# Patient Record
Sex: Female | Born: 1970
Health system: Southern US, Community
[De-identification: ages and names within clinical notes are randomized; demographics above are authoritative.]

## PROBLEM LIST (undated history)

## (undated) DIAGNOSIS — Z9889 Other specified postprocedural states: Secondary | ICD-10-CM

## (undated) DIAGNOSIS — S82841A Displaced bimalleolar fracture of right lower leg, initial encounter for closed fracture: Secondary | ICD-10-CM

## (undated) DIAGNOSIS — D649 Anemia, unspecified: Secondary | ICD-10-CM

## (undated) DIAGNOSIS — E785 Hyperlipidemia, unspecified: Secondary | ICD-10-CM

## (undated) DIAGNOSIS — D573 Sickle-cell trait: Secondary | ICD-10-CM

## (undated) HISTORY — PX: OOPHORECTOMY: SHX86

## (undated) HISTORY — DX: Sickle-cell trait: D57.3

## (undated) HISTORY — DX: Hyperlipidemia, unspecified: E78.5

## (undated) HISTORY — PX: WISDOM TOOTH EXTRACTION: SHX21

## (undated) HISTORY — PX: OVARIAN CYST SURGERY: SHX726

---

## 1999-08-12 ENCOUNTER — Encounter (INDEPENDENT_AMBULATORY_CARE_PROVIDER_SITE_OTHER): Payer: Self-pay

## 1999-08-12 ENCOUNTER — Inpatient Hospital Stay (HOSPITAL_COMMUNITY): Admission: AD | Admit: 1999-08-12 | Discharge: 1999-08-16 | Payer: Self-pay | Admitting: Obstetrics and Gynecology

## 1999-08-16 ENCOUNTER — Inpatient Hospital Stay (HOSPITAL_COMMUNITY): Admission: AD | Admit: 1999-08-16 | Discharge: 1999-08-16 | Payer: Self-pay | Admitting: Obstetrics & Gynecology

## 2000-08-17 ENCOUNTER — Encounter: Admission: RE | Admit: 2000-08-17 | Discharge: 2000-08-17 | Payer: Self-pay | Admitting: Obstetrics and Gynecology

## 2000-08-17 ENCOUNTER — Encounter: Payer: Self-pay | Admitting: Obstetrics and Gynecology

## 2001-03-14 ENCOUNTER — Encounter: Admission: RE | Admit: 2001-03-14 | Discharge: 2001-03-14 | Payer: Self-pay | Admitting: Cardiology

## 2001-03-14 ENCOUNTER — Encounter: Payer: Self-pay | Admitting: Cardiology

## 2001-11-15 ENCOUNTER — Other Ambulatory Visit: Admission: RE | Admit: 2001-11-15 | Discharge: 2001-11-15 | Payer: Self-pay | Admitting: Obstetrics and Gynecology

## 2002-12-03 ENCOUNTER — Other Ambulatory Visit: Admission: RE | Admit: 2002-12-03 | Discharge: 2002-12-03 | Payer: Self-pay | Admitting: Obstetrics and Gynecology

## 2004-01-15 ENCOUNTER — Other Ambulatory Visit: Admission: RE | Admit: 2004-01-15 | Discharge: 2004-01-15 | Payer: Self-pay | Admitting: Obstetrics and Gynecology

## 2004-02-02 ENCOUNTER — Emergency Department (HOSPITAL_COMMUNITY): Admission: EM | Admit: 2004-02-02 | Discharge: 2004-02-02 | Payer: Self-pay | Admitting: Emergency Medicine

## 2005-04-15 ENCOUNTER — Other Ambulatory Visit: Admission: RE | Admit: 2005-04-15 | Discharge: 2005-04-15 | Payer: Self-pay | Admitting: Obstetrics and Gynecology

## 2007-07-13 ENCOUNTER — Inpatient Hospital Stay (HOSPITAL_COMMUNITY): Admission: RE | Admit: 2007-07-13 | Discharge: 2007-07-16 | Payer: Self-pay | Admitting: Obstetrics and Gynecology

## 2011-02-09 NOTE — H&P (Signed)
NAMEARETHA, Haley Pitts               ACCOUNT NO.:  [id number]   MEDICAL RECORD NO.:  [id number]          PATIENT TYPE:  INP   LOCATION:  NA                            [facility]  [facility]   PHYSICIAN:  Guy Sandifer. Henderson Cloud, M.D. DATE OF BIRTH:  1971-06-12   DATE OF ADMISSION:  07/13/2007  DATE OF DISCHARGE:                              HISTORY & PHYSICAL   CHIEF COMPLAINT:  Desires repeat cesarean section.   HISTORY OF PRESENT ILLNESS:  This patient is a 40 year old black female,  G4, P1 with an EDC of July 22, 2007, confirmed by first trimester  ultrasound, who has a history of cesarean section. After discussion of  the options she desires repeat. Potential risks and complications have  been discussed preoperatively.   PAST MEDICAL HISTORY/PAST SURGICAL HISTORY/FAMILY HISTORY/OBSTETRIC  HISTORY:  See prenatal history and physical.   SOCIAL HISTORY:  The patient denies tobacco, alcohol, or drug abuse.   MEDICATIONS:  Prenatal vitamins.   ALLERGIES:  NO KNOWN DRUG ALLERGIES.   REVIEW OF SYSTEMS:  NEUROLOGIC:  Denies headache. CARDIOVASCULAR:  Denies chest pain. PULMONARY:  Denies shortness of breath.  GASTROINTESTINAL:  Denies recent changes in bowel habits.   PHYSICAL EXAMINATION:  VITAL SIGNS:  Height 5 feet 7 inches. Weight 217  pounds. Blood pressure 96/60.  HEENT:  Without thyromegaly.  LUNGS:  Clear to auscultation.  HEART:  Regular rate and rhythm.  BACK: Without CVA tenderness.  BREAST:  Not examined.  ABDOMEN:  Gravid. No epigastric tenderness.  PELVIC:  Cervix closed, thick, and high.  EXTREMITIES:  DTR's 2+.   ASSESSMENT:  Desires repeat cesarean section.   PLAN:  Repeat cesarean section.      Guy Sandifer Henderson Cloud, M.D.  Electronically Signed    JET/MEDQ  D:  07/06/2007  T:  07/06/2007  Job:  [id number]

## 2011-02-09 NOTE — H&P (Signed)
NAMENEELEY, SEDIVY               ACCOUNT NO.:  [id number]   MEDICAL RECORD NO.:  [id number]          PATIENT TYPE:  INP   LOCATION:  9132                          [facility]   PHYSICIAN:  Guy Sandifer. Henderson Cloud, M.D. DATE OF BIRTH:  04-26-1971   DATE OF ADMISSION:  07/13/2007  DATE OF DISCHARGE:                              HISTORY & PHYSICAL   .   CHIEF COMPLAINT:  Desires repeat cesarean section.   HISTORY OF PRESENT ILLNESS:  The patient is a 40 year old African-  American female, G4, P1 with a history of previous cesarean section.  Her EDC is July 22, 2007 confirmed by first trimester ultrasound.  After discussion of options, she prefers repeat cesarean section.  Potential risks and complications have been reviewed preoperatively.   Past medical history, past surgical history, family history, obstetric  history, see prenatal history and physical.   MEDICATIONS:  Prenatal vitamins.   ALLERGIES:  NO KNOWN DRUG ALLERGIES.   REVIEW OF SYSTEMS:  NEURO:  Denies headache.  CARDIAC:  Denies chest  pain.  PULMONARY:  Denies  shortness of breath.  GI:  Denies recent  change in bowel habits.   PHYSICAL EXAMINATION:  VITAL SIGNS:  Height 5 fee5 7 inches, weight 217  pounds, blood pressure 96/60.  LUNGS:  Clear to auscultation.  HEART:  Regular rate and rhythm.  BACK:  Without CVA tenderness.  BREASTS:  Not examined.  ABDOMEN:  Gravid.  Cervix closed, thick and high.  EXTREMITIES:  Grossly within normal limits.  NEUROLOGIC:  Grossly within normal limits.   ASSESSMENT:  Previous cesarean section and desires repeat.   PLAN:  Repeat cesarean section.      Guy Sandifer Henderson Cloud, M.D.  Electronically Signed     JET/MEDQ  D:  07/12/2007  T:  07/13/2007  Job:  [id number]

## 2011-02-09 NOTE — Op Note (Signed)
NAMECALYN, Haley Pitts               ACCOUNT NO.:  [id number]   MEDICAL RECORD NO.:  [id number]          PATIENT TYPE:  INP   LOCATION:  NA                            [facility]   PHYSICIAN:  Guy Sandifer. Henderson Cloud, M.D. DATE OF BIRTH:  Oct 17, 1970   DATE OF PROCEDURE:  07/13/2007  DATE OF DISCHARGE:                               OPERATIVE REPORT   PREOPERATIVE DIAGNOSES:  1. Anterior pregnancy at term.  2. Previous cesarean section, desires repeat.   POSTOPERATIVE DIAGNOSES:  1. Anterior pregnancy at term.  2. Previous cesarean section, desires repeat.   PROCEDURE:  Repeat low transverse cesarean section.   SURGEON:  Guy Sandifer. Henderson Cloud, M.D.   ANESTHESIA:  Spinal, Joanette Gula M.D.   ESTIMATED BLOOD LOSS:  800 mL.   FINDINGS:  Viable female infant, Apgars of 9 and 9 at one and five  minutes, respectively.  Birth weight and arterial cord pH pending.   INDICATIONS AND CONSENT:  This patient is a 40 year old black female G4,  P1, with an EDC of July 22, 2007, confirmed by first trimester  ultrasound.  Details are dictated in the history and physical.  She has  a history of a cesarean section and, after discussion of options,  desires repeat.  Potential risks and complications have been discussed  preoperatively, including but not limited to infection, organ damage,  bleeding requiring transfusion with HIV and hepatitis risk, DVT, PE, and  pneumonia.  All questions have been answered, and consent is signed in  the chart.   PROCEDURE:  The patient is taken to the operating room, where she is  identified, spinal anesthetic is placed, and she is placed in the dorsal  supine position with a 15-degree left lateral wedge.  She is then  prepped, Foley catheter is placed, and the bladder is drained, and she  is draped in a sterile fashion.  After testing for adequate spinal  anesthesia, skin is entered through a Pfannenstiel incision.  Old scar  is taken down out on the way in.   Dissection is carried out in layers to  the peritoneum.  Peritoneum is incised and extended superiorly and  inferiorly.  Vesicouterine peritoneum is taken down cephalolaterally.  The bladder flap is developed, and the bladder blade is placed.  Uterus  is incised in a low transverse manner, and the uterine cavity is entered  bluntly with a hemostat.  The uterine incision is extended  cephalolaterally with the fingers.  Artificial rupture of membranes for  clear fluid is carried out.  The vertex is delivered without difficulty.  Oro- and nasopharynx were suctioned.  A nuchal cord x1 is reduced.  Baby  is then delivered without difficulty.  Good cry and tone is noted.  Cord  is clamped and cut.  The baby is handed to the awaiting pediatrics team.  Placenta is manually delivered.  Uterine cavity is clean.  Uterus is  closed in two running-locking imbricating layers of 0 Monocryl suture.  Figure-of-eight 0 Monocryl sutures are applied to the incision to obtain  complete hemostasis.  Tubes and ovaries are normal  bilaterally.  Anterior peritoneum is closed in a running  fashion with 0 Monocryl suture.  Rectus fascia is closed in a running  fashion with 0 PDS suture, and the skin is closed with clips.  All  sponge, instrument, and needle counts are correct, and the patient is  transferred to the recovery room in stable condition.      Guy Sandifer Henderson Cloud, M.D.  Electronically Signed     JET/MEDQ  D:  07/13/2007  T:  07/14/2007  Job:  [id number]

## 2011-02-12 NOTE — Discharge Summary (Signed)
[name], Haley Pitts               ACCOUNT NO.:  [id number]   MEDICAL RECORD NO.:  [id number]          PATIENT TYPE:  INP   LOCATION:  9132                          [facility]   PHYSICIAN:  Guy Sandifer. Henderson Cloud, M.D. DATE OF BIRTH:  Jun 03, 1971   DATE OF ADMISSION:  07/13/2007  DATE OF DISCHARGE:  07/16/2007                               DISCHARGE SUMMARY   ADMITTING DIAGNOSES:  1. Intrauterine pregnancy at term.  2. Previous cesarean section, desires repeat.   DISCHARGE DIAGNOSES:  1. Status post low transverse cesarean section.  2. Viable female infant.   PROCEDURE:  Repeat low transverse cesarean section.   REASON FOR ADMISSION:  Please see dictated H&P.   HOSPITAL COURSE:  The patient is 40 year old black female gravida 4,  para 1 that was admitted to [facility] for scheduled  cesarean section.  The patient had had a previous cesarean delivery and  desired repeat.  On the morning of admission, the patient was taken to  the operating room where spinal anesthesia was administered without  difficulty.  A low transverse incision was made with delivery of a  viable female infant weighing 7 pounds 13 ounces, Apgars of 9 at one  minute and 9 at 5 minutes.  The patient tolerated the procedure well and  was taken to the recovery room in stable condition.  On postoperative  day #1, the patient was without complaint.  Vital signs remained stable.  She was afebrile.  Fundus firm and nontender.  Abdominal dressing was  noted be clean, dry and intact.  Laboratory findings revealed hemoglobin  of 9.1.  Postoperative day #2 the patient was without complaint.  Vital  signs were stable.  Abdomen soft.  Fundus firm and nontender.  Abdominal  dressing had been removed revealing an incision that was clean, dry and  intact.  She was tolerating a regular diet by complaints of nausea,  vomiting, ambulating without difficulty.  On postoperative day #3, the  patient was without complaint.   Vital signs remained stable.  She was  afebrile.  Fundus firm and nontender.  Incision was clean, dry and  intact.  Staples removed and the patient was later discharged home.   CONDITION ON DISCHARGE:  Stable.   DISCHARGE INSTRUCTIONS:  1. Diet:  Regular as tolerated.  2. Activity:  No heavy lifting, no driving x2 weeks, no vaginal entry.   FOLLOW UP:  Patient to follow up in the office in 1-2 weeks for an  incision check.  She is to call for temperature greater than 100  degrees, persistent nausea, vomiting, heavy vaginal bleeding and/or  redness or drainage from the incisional site.   DISCHARGE MEDICATIONS:  1. Percocet 5/325 #30 one p.o. q.4-6 h p.r.n.  2. Motrin 600 mg q.6 h.  3. Prenatal vitamins one p.o. daily.  4. Colace one p.o. daily.      Julio Sicks, N.P.      Guy Sandifer. Henderson Cloud, M.D.  Electronically Signed    CC/MEDQ  D:  08/07/2007  T:  08/07/2007  Job:  [id number]

## 2011-02-12 NOTE — Op Note (Signed)
[facility]  Patient:    Haley Pitts                       MRN: [id number] Proc. Date: 08/13/99 Adm. Date:  [id number] Attending:  Donne Hazel                           Operative Report  PREOPERATIVE DIAGNOSIS:       Arrest of cervical dilation.  Nonreassuring fetal  heart rate tracing.  POSTOPERATIVE DIAGNOSIS:      Arrest of cervical dilation.  Nonreassuring fetal  heart rate tracing.  OPERATION:                    Primary low transverse cesarean section.  SURGEON:                      Guy Sandifer. Arleta Creek, M.D.  ASSISTANT:  ANESTHESIA:                   Epidural.  ESTIMATED BLOOD LOSS:         800 cc.  FINDINGS:                     A viable female infant with Apgars of 8 and 9 at ne and five minutes, respectively.  Birth weight pending.  Arterial cord pH 7.29.   INDICATIONS:                  This patient is a 40 year old married black female, gravida 2, para 0, abortus 1, at 40-4/7 weeks who was admitted yesterday after  spontaneous decelleration was noted on the nonstress test in the office.  The patient was admitted for induction of labor.  She was making steady progress. he infant would intermittently display decellerations which would then recover. Thick meconium was noted and IUPC was placed with amnioinfusion during the night. The patient progressed to 6 to 7 cm and -2 station with no progress over two hours ith adequate labor.  Cesarean section was recommended.  The procedure and possible risks was discussed with the patient and her husband.  All questions were answered.  DESCRIPTION OF PROCEDURE:     The patient is taken to the operating room where epidural anesthetic is augmented to a surgical level.  Foley catheter is then placed.  She is prepped and draped in the usual sterile fashion.  After testing for adequate epidural anesthesia, the skin is entered through a Pfannenstiel incision and dissection is  carried out in layers to the peritoneum.  The peritoneum is incised and extended superiorly and inferiorly.  The vesicouterine peritoneum is taken down cephalolaterally, the bladder flap is developed, and the bladder blade is placed.  The uterus is incised in the low transverse manner and the cavity is entered bluntly.  The uterine incision is extended cephalolaterally with the fingers.  The vertex is delivered.  Oral and nasopharynx are thoroughly suctioned with the DeLee.  The infant cries upon delivery of the head.  The remainder of he infant is then delivered, cord is clamped and cut, and the infant is handed to he awaiting pediatrics team.  The placenta is manually delivered and sent to pathology.  Internal uterine contour is normal.  The uterus is closed in a running locking layer of 0 Monocryl suture.  Good hemostasis is noted.  Irrigation is carried out.  Anterior peritoneum is closed in a running fashion with 0 Monocryl suture which is also used to reapproximate the pyramidalis muscle in the midline. The anterior rectus fascia is closed in a running fashion with 0 PDS suture. The skin is closed with clips.  All sponge, needle, and instrument counts were correct. The patient is transferred to the recovery room in stable condition. DD:  08/13/99 TD:  08/13/99 Job: [id number]

## 2011-07-07 LAB — CBC
HCT: 27.3 — ABNORMAL LOW
HCT: 33.8 — ABNORMAL LOW
Hemoglobin: 11.5 — ABNORMAL LOW
Hemoglobin: 9.1 — ABNORMAL LOW
MCHC: 33.5
MCHC: 34.1
MCV: 85.8
MCV: 86.8
Platelets: 188
Platelets: 218
RBC: 3.15 — ABNORMAL LOW
RBC: 3.94
RDW: 14.4 — ABNORMAL HIGH
RDW: 14.6 — ABNORMAL HIGH
WBC: 14.2 — ABNORMAL HIGH
WBC: 6.2

## 2011-07-07 LAB — RPR: RPR Ser Ql: NONREACTIVE

## 2018-08-16 DIAGNOSIS — Z0000 Encounter for general adult medical examination without abnormal findings: Secondary | ICD-10-CM | POA: Diagnosis not present

## 2018-08-16 DIAGNOSIS — Z23 Encounter for immunization: Secondary | ICD-10-CM | POA: Diagnosis not present

## 2018-08-16 DIAGNOSIS — Z0001 Encounter for general adult medical examination with abnormal findings: Secondary | ICD-10-CM | POA: Diagnosis not present

## 2018-08-16 DIAGNOSIS — R102 Pelvic and perineal pain: Secondary | ICD-10-CM | POA: Diagnosis not present

## 2018-09-28 DIAGNOSIS — R1032 Left lower quadrant pain: Secondary | ICD-10-CM | POA: Diagnosis not present

## 2018-10-05 DIAGNOSIS — R1032 Left lower quadrant pain: Secondary | ICD-10-CM | POA: Diagnosis not present

## 2018-10-05 DIAGNOSIS — N951 Menopausal and female climacteric states: Secondary | ICD-10-CM | POA: Diagnosis not present

## 2018-11-15 DIAGNOSIS — N858 Other specified noninflammatory disorders of uterus: Secondary | ICD-10-CM | POA: Diagnosis not present

## 2018-11-15 DIAGNOSIS — N950 Postmenopausal bleeding: Secondary | ICD-10-CM | POA: Diagnosis not present

## 2018-11-15 DIAGNOSIS — N83202 Unspecified ovarian cyst, left side: Secondary | ICD-10-CM | POA: Diagnosis not present

## 2018-12-04 DIAGNOSIS — Z6830 Body mass index (BMI) 30.0-30.9, adult: Secondary | ICD-10-CM | POA: Diagnosis not present

## 2018-12-04 DIAGNOSIS — Z01419 Encounter for gynecological examination (general) (routine) without abnormal findings: Secondary | ICD-10-CM | POA: Diagnosis not present

## 2018-12-04 DIAGNOSIS — Z1231 Encounter for screening mammogram for malignant neoplasm of breast: Secondary | ICD-10-CM | POA: Diagnosis not present

## 2018-12-06 ENCOUNTER — Other Ambulatory Visit: Payer: Self-pay | Admitting: Obstetrics and Gynecology

## 2018-12-06 DIAGNOSIS — R928 Other abnormal and inconclusive findings on diagnostic imaging of breast: Secondary | ICD-10-CM

## 2018-12-11 ENCOUNTER — Other Ambulatory Visit: Payer: Self-pay | Admitting: Obstetrics and Gynecology

## 2018-12-11 ENCOUNTER — Other Ambulatory Visit: Payer: Self-pay

## 2018-12-11 ENCOUNTER — Ambulatory Visit
Admission: RE | Admit: 2018-12-11 | Discharge: 2018-12-11 | Disposition: A | Discharge status: Home / Self Care | Payer: BLUE CROSS/BLUE SHIELD | Source: Ambulatory Visit | Attending: Obstetrics and Gynecology | Admitting: Obstetrics and Gynecology

## 2018-12-11 DIAGNOSIS — N63 Unspecified lump in unspecified breast: Secondary | ICD-10-CM

## 2018-12-11 DIAGNOSIS — R928 Other abnormal and inconclusive findings on diagnostic imaging of breast: Secondary | ICD-10-CM

## 2018-12-11 DIAGNOSIS — N6324 Unspecified lump in the left breast, lower inner quadrant: Secondary | ICD-10-CM | POA: Diagnosis not present

## 2018-12-11 DIAGNOSIS — R922 Inconclusive mammogram: Secondary | ICD-10-CM | POA: Diagnosis not present

## 2018-12-11 DIAGNOSIS — N6322 Unspecified lump in the left breast, upper inner quadrant: Secondary | ICD-10-CM | POA: Diagnosis not present

## 2019-01-18 DIAGNOSIS — N83202 Unspecified ovarian cyst, left side: Secondary | ICD-10-CM | POA: Diagnosis not present

## 2019-03-06 DIAGNOSIS — N83202 Unspecified ovarian cyst, left side: Secondary | ICD-10-CM | POA: Diagnosis not present

## 2019-03-12 ENCOUNTER — Other Ambulatory Visit: Payer: Self-pay | Admitting: Obstetrics and Gynecology

## 2019-03-12 ENCOUNTER — Other Ambulatory Visit (HOSPITAL_COMMUNITY)
Admission: RE | Admit: 2019-03-12 | Discharge: 2019-03-12 | Disposition: A | Discharge status: Home / Self Care | Payer: BC Managed Care – PPO | Source: Ambulatory Visit | Attending: Obstetrics and Gynecology | Admitting: Obstetrics and Gynecology

## 2019-03-12 DIAGNOSIS — N8302 Follicular cyst of left ovary: Secondary | ICD-10-CM | POA: Diagnosis not present

## 2019-03-12 DIAGNOSIS — N83202 Unspecified ovarian cyst, left side: Secondary | ICD-10-CM | POA: Insufficient documentation

## 2019-06-13 ENCOUNTER — Ambulatory Visit
Admission: RE | Admit: 2019-06-13 | Discharge: 2019-06-13 | Disposition: A | Discharge status: Home / Self Care | Payer: BC Managed Care – PPO | Source: Ambulatory Visit | Attending: Obstetrics and Gynecology | Admitting: Obstetrics and Gynecology

## 2019-06-13 ENCOUNTER — Other Ambulatory Visit: Payer: Self-pay

## 2019-06-13 ENCOUNTER — Other Ambulatory Visit: Payer: Self-pay | Admitting: Obstetrics and Gynecology

## 2019-06-13 ENCOUNTER — Ambulatory Visit
Admission: RE | Admit: 2019-06-13 | Discharge: 2019-06-13 | Disposition: A | Discharge status: Home / Self Care | Payer: BLUE CROSS/BLUE SHIELD | Source: Ambulatory Visit | Attending: Obstetrics and Gynecology | Admitting: Obstetrics and Gynecology

## 2019-06-13 DIAGNOSIS — N63 Unspecified lump in unspecified breast: Secondary | ICD-10-CM

## 2019-06-13 DIAGNOSIS — R928 Other abnormal and inconclusive findings on diagnostic imaging of breast: Secondary | ICD-10-CM | POA: Diagnosis not present

## 2019-06-13 DIAGNOSIS — N6489 Other specified disorders of breast: Secondary | ICD-10-CM | POA: Diagnosis not present

## 2019-06-26 ENCOUNTER — Other Ambulatory Visit: Payer: Self-pay | Admitting: Obstetrics and Gynecology

## 2019-08-13 DIAGNOSIS — E7800 Pure hypercholesterolemia, unspecified: Secondary | ICD-10-CM | POA: Diagnosis not present

## 2019-08-13 DIAGNOSIS — Z0000 Encounter for general adult medical examination without abnormal findings: Secondary | ICD-10-CM | POA: Diagnosis not present

## 2019-08-20 DIAGNOSIS — Z7189 Other specified counseling: Secondary | ICD-10-CM | POA: Diagnosis not present

## 2019-08-20 DIAGNOSIS — Z0000 Encounter for general adult medical examination without abnormal findings: Secondary | ICD-10-CM | POA: Diagnosis not present

## 2019-12-11 DIAGNOSIS — N952 Postmenopausal atrophic vaginitis: Secondary | ICD-10-CM | POA: Diagnosis not present

## 2019-12-11 DIAGNOSIS — Z6830 Body mass index (BMI) 30.0-30.9, adult: Secondary | ICD-10-CM | POA: Diagnosis not present

## 2019-12-11 DIAGNOSIS — Z01419 Encounter for gynecological examination (general) (routine) without abnormal findings: Secondary | ICD-10-CM | POA: Diagnosis not present

## 2019-12-12 ENCOUNTER — Ambulatory Visit
Admission: RE | Admit: 2019-12-12 | Discharge: 2019-12-12 | Disposition: A | Discharge status: Home / Self Care | Payer: BC Managed Care – PPO | Source: Ambulatory Visit | Attending: Obstetrics and Gynecology | Admitting: Obstetrics and Gynecology

## 2019-12-12 ENCOUNTER — Other Ambulatory Visit: Payer: Self-pay

## 2019-12-12 ENCOUNTER — Other Ambulatory Visit: Payer: BC Managed Care – PPO

## 2019-12-12 DIAGNOSIS — N63 Unspecified lump in unspecified breast: Secondary | ICD-10-CM

## 2019-12-12 DIAGNOSIS — N6325 Unspecified lump in the left breast, overlapping quadrants: Secondary | ICD-10-CM | POA: Diagnosis not present

## 2020-02-04 ENCOUNTER — Ambulatory Visit: Payer: BC Managed Care – PPO | Attending: Internal Medicine

## 2020-02-04 DIAGNOSIS — Z23 Encounter for immunization: Secondary | ICD-10-CM

## 2020-02-04 NOTE — Progress Notes (Signed)
   Covid-19 Vaccination Clinic  Name:  Haley Pitts    MRN: [id number] DOB: 01/31/1971  02/04/2020  [name] was observed post Covid-19 immunization for 15 minutes without incident. She was provided with Vaccine Information Sheet and instruction to access the V-Safe system.   [name] was instructed to call 911 with any severe reactions post vaccine: Marland Kitchen Difficulty breathing  . Swelling of face and throat  . A fast heartbeat  . A bad rash all over body  . Dizziness and weakness   Immunizations Administered    Name Date Dose VIS Date Route   Pfizer COVID-19 Vaccine 02/04/2020  2:36 PM 0.3 mL 11/21/2018 Intramuscular   Manufacturer: [employer], [employer]   Lot: [id number]   NDC: [address]-0

## 2020-02-26 ENCOUNTER — Ambulatory Visit: Payer: BC Managed Care – PPO | Attending: Internal Medicine

## 2020-02-26 DIAGNOSIS — Z23 Encounter for immunization: Secondary | ICD-10-CM

## 2020-02-26 NOTE — Progress Notes (Signed)
   Covid-19 Vaccination Clinic  Name:  Haley Pitts    MRN: [id number] DOB: 04/10/1971  02/26/2020  [name] was observed post Covid-19 immunization for 15 minutes without incident. She was provided with Vaccine Information Sheet and instruction to access the V-Safe system.   [name] was instructed to call 911 with any severe reactions post vaccine: Marland Kitchen Difficulty breathing  . Swelling of face and throat  . A fast heartbeat  . A bad rash all over body  . Dizziness and weakness   Immunizations Administered    Name Date Dose VIS Date Route   Pfizer COVID-19 Vaccine 02/26/2020  1:04 PM 0.3 mL 11/21/2018 Intramuscular   Manufacturer: [employer], [employer]   Lot: [id number]   NDC: [address]-3

## 2020-09-23 ENCOUNTER — Other Ambulatory Visit: Payer: Self-pay | Admitting: Obstetrics and Gynecology

## 2020-09-23 DIAGNOSIS — N6320 Unspecified lump in the left breast, unspecified quadrant: Secondary | ICD-10-CM

## 2020-09-24 DIAGNOSIS — Z1159 Encounter for screening for other viral diseases: Secondary | ICD-10-CM | POA: Diagnosis not present

## 2020-10-14 ENCOUNTER — Ambulatory Visit: Payer: Self-pay | Admitting: Cardiovascular Disease

## 2020-10-16 NOTE — Progress Notes (Signed)
Cardiology Office Note:   Date:  10/17/2020  NAME:  Haley Pitts    MRN: [id number] DOB:  July 07, 1971   PCP:  Aliene Beams, MD  Cardiologist:  No primary care provider on file.  Electrophysiologist:  None   Referring MD: Aliene Beams, MD   Chief Complaint  Patient presents with  . Chest Pain    History of Present Illness:   Haley Pitts is a 50 y.o. female without medical history who is being seen today for the evaluation of chest pain at the request of Aliene Beams, MD.  She reports for several years she has had pain in the left upper quadrant of her left chest.  She describes achiness and soreness periodically.  Can occur every few months.  Symptoms appear to be worse with palpation of the area.  She describes it as tenderness in the area.  It is dull.  No exertional component.  Not alleviated by rest.  She has no real medical problems.  No high blood pressure or diabetes.  She is never had a heart attack or stroke.  Most recent labs show total cholesterol 213, HDL 71, LDL 130, triglycerides 70.  No strong family history of heart disease.  She does not smoke.  She rarely drinks alcohol.  She does not use drugs.  She works as a Secretary/administrator.  She is married with 2 children.  EKG in office demonstrates normal sinus rhythm heart rate 75 with nonspecific ST-T changes.  No murmurs on examination.  Symptoms are not alleviated by rest.  On my examination she is tender in the area when I palpate.  This is noted near the upper left clavicle close to the shoulder.  She reports she plays tennis 3 days/week.  She can plan for up to 45 minutes without any chest pain or shortness of breath.  No palpitations reported.  No lower extremity edema.  Past Medical History: History reviewed. No pertinent past medical history.  Past Surgical History: Past Surgical History:  Procedure Laterality Date  . CESAREAN SECTION    . OVARIAN CYST SURGERY      Current Medications: Current Meds   Medication Sig  . Iron, Ferrous Sulfate, 325 (65 Fe) MG TABS Take 1 tablet by mouth as directed.  . Multiple Vitamin (MULTIVITAMIN ADULT PO) multivitamin  . Omega-3 Fatty Acids (OMEGA 3 PO) Take by mouth.     Allergies:    Patient has no known allergies.   Social History: Social History   Socioeconomic History  . Marital status: Married    Spouse name: Not on file  . Number of children: 2  . Years of education: Not on file  . Highest education level: Not on file  Occupational History  . Occupation: Diplomatic Services operational officer  Tobacco Use  . Smoking status: Never Smoker  . Smokeless tobacco: Never Used  Substance and Sexual Activity  . Alcohol use: Yes  . Drug use: Never  . Sexual activity: Not on file  Other Topics Concern  . Not on file  Social History Narrative  . Not on file   Social Determinants of Health   Financial Resource Strain: Not on file  Food Insecurity: Not on file  Transportation Needs: Not on file  Physical Activity: Not on file  Stress: Not on file  Social Connections: Not on file     Family History: The patient's family history includes Diabetes in her mother; Hypertension in her mother.  ROS:   All other  ROS reviewed and negative. Pertinent positives noted in the HPI.     EKGs/Labs/Other Studies Reviewed:   The following studies were personally reviewed by me today:  EKG:  EKG is ordered today.  The ekg ordered today demonstrates NSR 75 bpm, NSTT, and was personally reviewed by me.   Recent Labs: No results found for requested labs within last 8760 hours.   Recent Lipid Panel No results found for: CHOL, TRIG, HDL, CHOLHDL, VLDL, LDLCALC, LDLDIRECT  Physical Exam:   VS:  BP 102/62   Pulse 75   Ht 5\' 7"  (1.702 m)   Wt 179 lb (81.2 kg)   BMI 28.04 kg/m    Wt Readings from Last 3 Encounters:  10/17/20 179 lb (81.2 kg)    General: Well nourished, well developed, in no acute distress Head: Atraumatic, normal size  Eyes: PEERLA, EOMI  Neck:  Supple, no JVD Endocrine: No thryomegaly Cardiac: Normal S1, S2; RRR; no murmurs, rubs, or gallops Lungs: Clear to auscultation bilaterally, no wheezing, rhonchi or rales  Abd: Soft, nontender, no hepatomegaly  Ext: No edema, pulses 2+ Musculoskeletal: No deformities, BUE and BLE strength normal and equal Skin: Warm and dry, no rashes   Neuro: Alert and oriented to person, place, time, and situation, CNII-XII grossly intact, no focal deficits  Psych: Normal mood and affect   ASSESSMENT:   CALAIS SVEHLA is a 50 y.o. female who presents for the following: 1. Chest pain, unspecified type     PLAN:   1. Chest pain, unspecified type -Atypical chest pain.  It is more consistent with left upper outer quadrant chest pain.  Tenderness over the left clavicle near the glenohumeral joint.  I suspect this is musculoskeletal in nature.  Her EKG demonstrates nonspecific ST-T changes which are likely related to breast artifact. -She maintains a high level of activity.  She really has no CVD risk factors.  Her most recent LDL cholesterol was 130 and HDL was 71. -Overall I think this is musculoskeletal.  She can take Tylenol and ibuprofen as needed. -I have recommended coronary calcium scoring.  This will further risk stratify her.  If this is 0 this will be further reassuring.  She will see 44 as needed.  Disposition: Return if symptoms worsen or fail to improve.  Medication Adjustments/Labs and Tests Ordered: Current medicines are reviewed at length with the patient today.  Concerns regarding medicines are outlined above.  Orders Placed This Encounter  Procedures  . CT CARDIAC SCORING (SELF PAY ONLY)   No orders of the defined types were placed in this encounter.   Patient Instructions  Medication Instructions:  The current medical regimen is effective;  continue present plan and medications.  *If you need a refill on your cardiac medications before your next appointment, please call your  pharmacy*  Testing/Procedures: CALCIUM SCORE   Follow-Up: At [facility], you and your health needs are our priority.  As part of our continuing mission to provide you with exceptional heart care, we have created designated Provider Care Teams.  These Care Teams include your primary Cardiologist (physician) and Advanced Practice Providers (APPs -  Physician Assistants and Nurse Practitioners) who all work together to provide you with the care you need, when you need it.  We recommend signing up for the patient portal called "MyChart".  Sign up information is provided on this After Visit Summary.  MyChart is used to connect with patients for Virtual Visits (Telemedicine).  Patients are able to view lab/test  results, encounter notes, upcoming appointments, etc.  Non-urgent messages can be sent to your provider as well.   To learn more about what you can do with MyChart, go to [email or web address].    Your next appointment:   As needed  The format for your next appointment:   In Person  Provider:   Lennie Odor, MD         Signed, Lenna Gilford. Flora Lipps, MD [facility]  [address], [address] [place], [place] [address] [phone]  10/17/2020 3:41 PM

## 2020-10-17 ENCOUNTER — Other Ambulatory Visit: Payer: Self-pay

## 2020-10-17 ENCOUNTER — Encounter: Payer: Self-pay | Admitting: Cardiovascular Disease

## 2020-10-17 ENCOUNTER — Ambulatory Visit (INDEPENDENT_AMBULATORY_CARE_PROVIDER_SITE_OTHER): Payer: 59 | Admitting: Cardiovascular Disease

## 2020-10-17 VITALS — BP 102/62 | HR 75 | Ht 67.0 in | Wt 179.0 lb

## 2020-10-17 DIAGNOSIS — R079 Chest pain, unspecified: Secondary | ICD-10-CM | POA: Diagnosis not present

## 2020-10-17 NOTE — Patient Instructions (Addendum)
Medication Instructions:  The current medical regimen is effective;  continue present plan and medications.  *If you need a refill on your cardiac medications before your next appointment, please call your pharmacy*   Testing/Procedures: CALCIUM SCORE   Follow-Up: At [facility], you and your health needs are our priority.  As part of our continuing mission to provide you with exceptional heart care, we have created designated Provider Care Teams.  These Care Teams include your primary Cardiologist (physician) and Advanced Practice Providers (APPs -  Physician Assistants and Nurse Practitioners) who all work together to provide you with the care you need, when you need it.  We recommend signing up for the patient portal called "MyChart".  Sign up information is provided on this After Visit Summary.  MyChart is used to connect with patients for Virtual Visits (Telemedicine).  Patients are able to view lab/test results, encounter notes, upcoming appointments, etc.  Non-urgent messages can be sent to your provider as well.   To learn more about what you can do with MyChart, go to [email or web address].    Your next appointment:   As needed  The format for your next appointment:   In Person  Provider:   [employer] O'[employer]al, MD     

## 2020-10-22 NOTE — Addendum Note (Signed)
Addended by: Joselyn Arrow on: 10/22/2020 12:05 PM   Modules accepted: Orders

## 2020-11-04 ENCOUNTER — Ambulatory Visit (INDEPENDENT_AMBULATORY_CARE_PROVIDER_SITE_OTHER)
Admission: RE | Admit: 2020-11-04 | Discharge: 2020-11-04 | Disposition: A | Discharge status: Home / Self Care | Payer: Self-pay | Source: Ambulatory Visit | Attending: Cardiovascular Disease | Admitting: Cardiovascular Disease

## 2020-11-04 ENCOUNTER — Other Ambulatory Visit: Payer: Self-pay

## 2020-11-04 DIAGNOSIS — R079 Chest pain, unspecified: Secondary | ICD-10-CM

## 2020-11-24 ENCOUNTER — Other Ambulatory Visit: Payer: Self-pay

## 2020-11-24 ENCOUNTER — Ambulatory Visit (AMBULATORY_SURGERY_CENTER): Payer: 59

## 2020-11-24 VITALS — Ht 67.0 in | Wt 174.0 lb

## 2020-11-24 DIAGNOSIS — Z1211 Encounter for screening for malignant neoplasm of colon: Secondary | ICD-10-CM

## 2020-11-24 MED ORDER — NA SULFATE-K SULFATE-MG SULF 17.5-3.13-1.6 GM/177ML PO SOLN: 1.0000 | Freq: Once | ORAL | 0 refills | Status: AC

## 2020-11-24 NOTE — Progress Notes (Signed)
Pre visit completed via phone; Patient verified name, DOB, and address; No egg or soy allergy known to patient  No issues with past sedation with any surgeries or procedures No intubation problems in the past  No FH of Malignant Hyperthermia No diet pills per patient No home 02 use per patient  No blood thinners per patient  Pt denies issues with constipation  No A fib or A flutter  EMMI video via MyChart  COVID 19 guidelines implemented in PV today with Pt and RN  Pt denies loose or missing teeth, dentures, partials, dental implants, or bonded teeth; Patient reports crown NO PA's for preps discussed with pt in PV today  Discussed with pt there will be an out-of-pocket cost for prep and that varies from $0 to 70 dollars  Due to the COVID-19 pandemic we are asking patients to follow certain guidelines. Pt aware of COVID protocols and [facility] guidelines

## 2020-12-09 ENCOUNTER — Ambulatory Visit (AMBULATORY_SURGERY_CENTER): Payer: 59 | Admitting: Internal Medicine

## 2020-12-09 ENCOUNTER — Other Ambulatory Visit: Payer: Self-pay

## 2020-12-09 ENCOUNTER — Encounter: Payer: Self-pay | Admitting: Internal Medicine

## 2020-12-09 VITALS — BP 97/61 | HR 58 | Temp 98.2°F | Resp 9 | Ht 67.0 in | Wt 174.0 lb

## 2020-12-09 DIAGNOSIS — Z1211 Encounter for screening for malignant neoplasm of colon: Secondary | ICD-10-CM | POA: Diagnosis present

## 2020-12-09 MED ORDER — SODIUM CHLORIDE 0.9 % IV SOLN: 500.0000 mL | Freq: Once | INTRAVENOUS | Status: DC

## 2020-12-09 NOTE — Progress Notes (Signed)
PT taken to PACU. Monitors in place. VSS. Report given to RN. 

## 2020-12-09 NOTE — Patient Instructions (Signed)
NO POLYPS!!!!!! Repeat colonoscopy in 10 years Resume previous diet Continue current medications  YOU HAD AN ENDOSCOPIC PROCEDURE [facility]:   Refer to the procedure report that was given to you for any specific questions about what was found during the examination.  If the procedure report does not answer your questions, please call your gastroenterologist to clarify.  If you requested that your care partner not be given the details of your procedure findings, then the procedure report has been included in a sealed envelope for you to review at your convenience later.  YOU SHOULD EXPECT: Some feelings of bloating in the abdomen. Passage of more gas than usual.  Walking can help get rid of the air that was put into your GI tract during the procedure and reduce the bloating. If you had a lower endoscopy (such as a colonoscopy or flexible sigmoidoscopy) you may notice spotting of blood in your stool or on the toilet paper. If you underwent a bowel prep for your procedure, you may not have a normal bowel movement for a few days.  Please Note:  You might notice some irritation and congestion in your nose or some drainage.  This is from the oxygen used during your procedure.  There is no need for concern and it should clear up in a day or so.  SYMPTOMS TO REPORT IMMEDIATELY:   Following lower endoscopy (colonoscopy or flexible sigmoidoscopy):  Excessive amounts of blood in the stool  Significant tenderness or worsening of abdominal pains  Swelling of the abdomen that is new, acute  Fever of 100F or higher  for urgent or emergent issues, a gastroenterologist can be reached at any hour by calling [phone]. Do not use MyChart messaging for urgent concerns.  DIET:  We do recommend a small meal at first, but then you may proceed to your regular diet.  Drink plenty of fluids but you should avoid alcoholic beverages for 24 hours.  ACTIVITY:  You should plan to take it  easy for the rest of today and you should NOT DRIVE or use heavy machinery until tomorrow (because of the sedation medicines used during the test).    FOLLOW UP: Our staff will call the number listed on your records 48-72 hours following your procedure to check on you and address any questions or concerns that you may have regarding the information given to you following your procedure. If we do not reach you, we will leave a message.  We will attempt to reach you two times.  During this call, we will ask if you have developed any symptoms of COVID 19. If you develop any symptoms (ie: fever, flu-like symptoms, shortness of breath, cough etc.) before then, please call [phone].  If you test positive for Covid 19 in the 2 weeks post procedure, please call and report this information to [place].    If any biopsies were taken you will be contacted by phone or by letter within the next 1-3 weeks.  Please call us at [phone] if you have not heard about the biopsies in 3 weeks.   SIGNATURES/CONFIDENTIALITY: You and/or your care partner have signed paperwork which will be entered into your electronic medical record.  These signatures attest to the fact that that the information above on your After Visit Summary has been reviewed and is understood.  Full responsibility of the confidentiality of this discharge information lies with you and/or your care-partner.

## 2020-12-09 NOTE — Progress Notes (Signed)
Pt's states no medical or surgical changes since previsit or office visit.  CW vitals and BC IV. 

## 2020-12-09 NOTE — Op Note (Signed)
[facility] Patient Name: Haley Pitts Procedure Date: 12/09/2020 11:03 AM MRN: [id number] Endoscopist: Wilhemina Bonito. Marina Goodell , MD Age: 50 Referring MD:  Date of Birth: [birth year] Gender: Female Account #: [id number] Procedure:                Colonoscopy Indications:              Screening for colorectal malignant neoplasm Medicines:                Monitored Anesthesia Care Procedure:                Pre-Anesthesia Assessment:                           - Prior to the procedure, a History and Physical                            was performed, and patient medications and                            allergies were reviewed. The patient's tolerance of                            previous anesthesia was also reviewed. The risks                            and benefits of the procedure and the sedation                            options and risks were discussed with the patient.                            All questions were answered, and informed consent                            was obtained. Prior Anticoagulants: The patient has                            taken no previous anticoagulant or antiplatelet                            agents. ASA Grade Assessment: II - A patient with                            mild systemic disease. After reviewing the risks                            and benefits, the patient was deemed in                            satisfactory condition to undergo the procedure.                           After obtaining informed consent, the colonoscope  was passed under direct vision. Throughout the                            procedure, the patient's blood pressure, pulse, and                            oxygen saturations were monitored continuously. The                            Colonoscope was introduced through the anus and                            advanced to the the cecum, identified by                            appendiceal orifice and  ileocecal valve. The                            ileocecal valve, appendiceal orifice, and rectum                            were photographed. The quality of the bowel                            preparation was excellent. The colonoscopy was                            performed without difficulty. The patient tolerated                            the procedure well. The bowel preparation used was                            SUPREP via split dose instruction. Scope In: 11:15:18 AM Scope Out: 11:36:17 AM Scope Withdrawal Time: 0 hours 12 minutes 31 seconds  Total Procedure Duration: 0 hours 20 minutes 59 seconds  Findings:                 The entire examined colon appeared normal on direct                            and retroflexion views. Complications:            No immediate complications. Estimated blood loss:                            None. Estimated Blood Loss:     Estimated blood loss: none. Impression:               - The entire examined colon is normal on direct and                            retroflexion views.                           - No specimens collected.  Recommendation:           - Repeat colonoscopy in 10 years for screening                            purposes.                           - Patient has a contact number available for                            emergencies. The signs and symptoms of potential                            delayed complications were discussed with the                            patient. Return to normal activities tomorrow.                            Written discharge instructions were provided to the                            patient.                           - Resume previous diet.                           - Continue present medications. Wilhemina Bonito. Marina Goodell, MD 12/09/2020 11:45:37 AM This report has been signed electronically.

## 2020-12-11 ENCOUNTER — Telehealth: Payer: Self-pay

## 2020-12-11 ENCOUNTER — Telehealth: Payer: Self-pay | Admitting: *Deleted

## 2020-12-11 NOTE — Telephone Encounter (Signed)
  Follow up Call-  Call back number 12/09/2020  Post procedure Call Back phone  # [phone]  Permission to leave phone message Yes  Some recent data might be hidden    LMOM to call back with any questions or concerns.  Also, call back if patient has developed fever, respiratory issues or been dx with COVID or had any family members or close contacts diagnosed since her procedure.

## 2020-12-11 NOTE — Telephone Encounter (Signed)
NO ANSWER, MESSAGE LEFT FOR PATIENT. 

## 2020-12-12 ENCOUNTER — Ambulatory Visit
Admission: RE | Admit: 2020-12-12 | Discharge: 2020-12-12 | Disposition: A | Discharge status: Home / Self Care | Payer: BC Managed Care – PPO | Source: Ambulatory Visit | Attending: Obstetrics and Gynecology | Admitting: Obstetrics and Gynecology

## 2020-12-12 ENCOUNTER — Other Ambulatory Visit: Payer: Self-pay

## 2020-12-12 DIAGNOSIS — N6320 Unspecified lump in the left breast, unspecified quadrant: Secondary | ICD-10-CM

## 2021-03-31 ENCOUNTER — Other Ambulatory Visit: Payer: Self-pay | Admitting: Orthopedic Surgery

## 2021-03-31 ENCOUNTER — Other Ambulatory Visit: Payer: Self-pay

## 2021-03-31 ENCOUNTER — Encounter (HOSPITAL_BASED_OUTPATIENT_CLINIC_OR_DEPARTMENT_OTHER): Payer: Self-pay | Admitting: Orthopedic Surgery

## 2021-03-31 NOTE — Anesthesia Preprocedure Evaluation (Addendum)
Anesthesia Evaluation  Patient identified by MRN, date of birth, ID band Patient awake    Reviewed: Allergy & Precautions, NPO status , Patient's Chart, lab work & pertinent test results  History of Anesthesia Complications (+) PONV and history of anesthetic complications  Airway Mallampati: II  TM Distance: >3 FB Neck ROM: Full    Dental no notable dental hx.    Pulmonary neg pulmonary ROS,    Pulmonary exam normal        Cardiovascular negative cardio ROS Normal cardiovascular exam     Neuro/Psych negative neurological ROS  negative psych ROS   GI/Hepatic negative GI ROS, Neg liver ROS,   Endo/Other  negative endocrine ROS  Renal/GU negative Renal ROS  negative genitourinary   Musculoskeletal Right ankle fracture   Abdominal   Peds  Hematology  (+) Sickle cell trait and anemia ,   Anesthesia Other Findings Day of surgery medications reviewed with patient.  Reproductive/Obstetrics negative OB ROS                           Anesthesia Physical Anesthesia Plan  ASA: 2  Anesthesia Plan: General   Post-op Pain Management: GA combined w/ Regional for post-op pain   Induction: Intravenous and Rapid sequence  PONV Risk Score and Plan: 4 or greater and Treatment may vary due to age or medical condition, Ondansetron, Dexamethasone, Midazolam and Scopolamine patch - Pre-op  Airway Management Planned: Oral ETT  Additional Equipment: None  Intra-op Plan:   Post-operative Plan: Extubation in OR  Informed Consent: I have reviewed the patients History and Physical, chart, labs and discussed the procedure including the risks, benefits and alternatives for the proposed anesthesia with the patient or authorized representative who has indicated his/her understanding and acceptance.     Dental advisory given  Plan Discussed with: CRNA  Anesthesia Plan Comments: (Patient nauseated on arrival  to preop. States she believes this is due to taking Percocet without food this morning. Somewhat improved after zofran IV. Plan for GETA/RSI. Stephannie Peters, MD)       Anesthesia Quick Evaluation

## 2021-04-01 ENCOUNTER — Encounter (HOSPITAL_BASED_OUTPATIENT_CLINIC_OR_DEPARTMENT_OTHER): Payer: Self-pay | Admitting: Orthopedic Surgery

## 2021-04-01 ENCOUNTER — Encounter (HOSPITAL_BASED_OUTPATIENT_CLINIC_OR_DEPARTMENT_OTHER)
Admission: RE | Disposition: A | Discharge status: Home / Self Care | Payer: Self-pay | Source: Home / Self Care | Attending: Orthopedic Surgery

## 2021-04-01 ENCOUNTER — Ambulatory Visit (HOSPITAL_BASED_OUTPATIENT_CLINIC_OR_DEPARTMENT_OTHER): Payer: 59 | Admitting: Anesthesiology

## 2021-04-01 ENCOUNTER — Ambulatory Visit (HOSPITAL_BASED_OUTPATIENT_CLINIC_OR_DEPARTMENT_OTHER)
Admission: RE | Admit: 2021-04-01 | Discharge: 2021-04-01 | Disposition: A | Discharge status: Home / Self Care | Payer: 59 | Attending: Orthopedic Surgery | Admitting: Orthopedic Surgery

## 2021-04-01 ENCOUNTER — Other Ambulatory Visit: Payer: Self-pay

## 2021-04-01 DIAGNOSIS — Z79899 Other long term (current) drug therapy: Secondary | ICD-10-CM | POA: Insufficient documentation

## 2021-04-01 DIAGNOSIS — S82841A Displaced bimalleolar fracture of right lower leg, initial encounter for closed fracture: Secondary | ICD-10-CM | POA: Insufficient documentation

## 2021-04-01 DIAGNOSIS — Y9373 Activity, racquet and hand sports: Secondary | ICD-10-CM | POA: Insufficient documentation

## 2021-04-01 DIAGNOSIS — D573 Sickle-cell trait: Secondary | ICD-10-CM | POA: Diagnosis not present

## 2021-04-01 DIAGNOSIS — Z96641 Presence of right artificial hip joint: Secondary | ICD-10-CM

## 2021-04-01 DIAGNOSIS — X500XXA Overexertion from strenuous movement or load, initial encounter: Secondary | ICD-10-CM | POA: Insufficient documentation

## 2021-04-01 DIAGNOSIS — Z8249 Family history of ischemic heart disease and other diseases of the circulatory system: Secondary | ICD-10-CM | POA: Diagnosis not present

## 2021-04-01 DIAGNOSIS — Z833 Family history of diabetes mellitus: Secondary | ICD-10-CM | POA: Insufficient documentation

## 2021-04-01 HISTORY — DX: Anemia, unspecified: D64.9

## 2021-04-01 HISTORY — PX: ORIF ANKLE FRACTURE: SHX5408

## 2021-04-01 HISTORY — DX: Other specified postprocedural states: Z98.890

## 2021-04-01 HISTORY — DX: Displaced bimalleolar fracture of right lower leg, initial encounter for closed fracture: S82.841A

## 2021-04-01 SURGERY — OPEN REDUCTION INTERNAL FIXATION (ORIF) ANKLE FRACTURE
Anesthesia: General | Site: Ankle | Laterality: Right

## 2021-04-01 MED ORDER — BUPIVACAINE-EPINEPHRINE (PF) 0.5% -1:200000 IJ SOLN
INTRAMUSCULAR | Status: DC | PRN
  Administered 2021-04-01: 20 mL via PERINEURAL
  Administered 2021-04-01: 10 mL via PERINEURAL

## 2021-04-01 MED ORDER — FENTANYL CITRATE (PF) 100 MCG/2ML IJ SOLN
100.0000 ug | Freq: Once | INTRAMUSCULAR | Status: AC
  Administered 2021-04-01: 50 ug via INTRAVENOUS

## 2021-04-01 MED ORDER — DEXAMETHASONE SODIUM PHOSPHATE 10 MG/ML IJ SOLN
INTRAMUSCULAR | Status: AC
  Filled 2021-04-01: qty 1

## 2021-04-01 MED ORDER — ACETAMINOPHEN 500 MG PO TABS
ORAL_TABLET | ORAL | Status: AC
  Filled 2021-04-01: qty 2

## 2021-04-01 MED ORDER — ONDANSETRON HCL 4 MG/2ML IJ SOLN
INTRAMUSCULAR | Status: DC | PRN
  Administered 2021-04-01: 4 mg via INTRAVENOUS

## 2021-04-01 MED ORDER — CEFAZOLIN SODIUM-DEXTROSE 2-4 GM/100ML-% IV SOLN
2.0000 g | INTRAVENOUS | Status: AC
  Administered 2021-04-01: 2 g via INTRAVENOUS

## 2021-04-01 MED ORDER — SCOPOLAMINE 1 MG/3DAYS TD PT72
MEDICATED_PATCH | TRANSDERMAL | Status: AC
  Filled 2021-04-01: qty 1

## 2021-04-01 MED ORDER — PROMETHAZINE HCL 25 MG/ML IJ SOLN: 6.2500 mg | INTRAMUSCULAR | Status: DC | PRN

## 2021-04-01 MED ORDER — PHENYLEPHRINE HCL (PRESSORS) 10 MG/ML IV SOLN
INTRAVENOUS | Status: DC | PRN
  Administered 2021-04-01 (×3): 80 ug via INTRAVENOUS

## 2021-04-01 MED ORDER — OXYCODONE HCL 5 MG PO TABS: 5.0000 mg | ORAL_TABLET | Freq: Once | ORAL | Status: DC | PRN

## 2021-04-01 MED ORDER — PROPOFOL 10 MG/ML IV BOLUS
INTRAVENOUS | Status: AC
  Filled 2021-04-01: qty 40

## 2021-04-01 MED ORDER — ONDANSETRON HCL 4 MG/2ML IJ SOLN
INTRAMUSCULAR | Status: AC
  Filled 2021-04-01: qty 2

## 2021-04-01 MED ORDER — EPHEDRINE SULFATE 50 MG/ML IJ SOLN
INTRAMUSCULAR | Status: DC | PRN
  Administered 2021-04-01: 10 mg via INTRAVENOUS
  Administered 2021-04-01: 5 mg via INTRAVENOUS

## 2021-04-01 MED ORDER — PROPOFOL 10 MG/ML IV BOLUS
INTRAVENOUS | Status: AC
  Filled 2021-04-01: qty 20

## 2021-04-01 MED ORDER — FENTANYL CITRATE (PF) 100 MCG/2ML IJ SOLN: 25.0000 ug | INTRAMUSCULAR | Status: DC | PRN

## 2021-04-01 MED ORDER — OXYCODONE-ACETAMINOPHEN 5-325 MG PO TABS
2.0000 | ORAL_TABLET | ORAL | Status: AC
  Administered 2021-04-01: 2 via ORAL

## 2021-04-01 MED ORDER — PROPOFOL 10 MG/ML IV BOLUS
INTRAVENOUS | Status: DC | PRN
  Administered 2021-04-01: 160 mg via INTRAVENOUS

## 2021-04-01 MED ORDER — PHENYLEPHRINE 40 MCG/ML (10ML) SYRINGE FOR IV PUSH (FOR BLOOD PRESSURE SUPPORT)
PREFILLED_SYRINGE | INTRAVENOUS | Status: AC
  Filled 2021-04-01: qty 10

## 2021-04-01 MED ORDER — OXYCODONE HCL 5 MG/5ML PO SOLN: 5.0000 mg | Freq: Once | ORAL | Status: DC | PRN

## 2021-04-01 MED ORDER — FENTANYL CITRATE (PF) 100 MCG/2ML IJ SOLN
INTRAMUSCULAR | Status: AC
  Filled 2021-04-01: qty 2

## 2021-04-01 MED ORDER — LIDOCAINE HCL (PF) 2 % IJ SOLN
INTRAMUSCULAR | Status: AC
  Filled 2021-04-01: qty 5

## 2021-04-01 MED ORDER — ACETAMINOPHEN 500 MG PO TABS: 1000.0000 mg | ORAL_TABLET | Freq: Once | ORAL | Status: DC

## 2021-04-01 MED ORDER — LACTATED RINGERS IV SOLN: INTRAVENOUS | Status: DC

## 2021-04-01 MED ORDER — ONDANSETRON HCL 4 MG/2ML IJ SOLN
4.0000 mg | Freq: Once | INTRAMUSCULAR | Status: AC
  Administered 2021-04-01: 4 mg via INTRAVENOUS

## 2021-04-01 MED ORDER — OXYCODONE-ACETAMINOPHEN 5-325 MG PO TABS
ORAL_TABLET | ORAL | Status: AC
  Filled 2021-04-01: qty 2

## 2021-04-01 MED ORDER — MIDAZOLAM HCL 2 MG/2ML IJ SOLN
INTRAMUSCULAR | Status: AC
  Filled 2021-04-01: qty 2

## 2021-04-01 MED ORDER — MIDAZOLAM HCL 5 MG/5ML IJ SOLN
INTRAMUSCULAR | Status: DC | PRN
  Administered 2021-04-01: 1 mg via INTRAVENOUS

## 2021-04-01 MED ORDER — DEXAMETHASONE SODIUM PHOSPHATE 10 MG/ML IJ SOLN
INTRAMUSCULAR | Status: DC | PRN
  Administered 2021-04-01: 5 mg via INTRAVENOUS

## 2021-04-01 MED ORDER — SCOPOLAMINE 1 MG/3DAYS TD PT72
1.0000 | MEDICATED_PATCH | Freq: Once | TRANSDERMAL | Status: DC
  Administered 2021-04-01: 1.5 mg via TRANSDERMAL

## 2021-04-01 MED ORDER — MIDAZOLAM HCL 2 MG/2ML IJ SOLN
2.0000 mg | Freq: Once | INTRAMUSCULAR | Status: AC
  Administered 2021-04-01: 1 mg via INTRAVENOUS

## 2021-04-01 MED ORDER — CLONIDINE HCL (ANALGESIA) 100 MCG/ML EP SOLN
EPIDURAL | Status: DC | PRN
  Administered 2021-04-01: 33 ug
  Administered 2021-04-01: 67 ug

## 2021-04-01 MED ORDER — CEFAZOLIN SODIUM-DEXTROSE 2-4 GM/100ML-% IV SOLN
INTRAVENOUS | Status: AC
  Filled 2021-04-01: qty 100

## 2021-04-01 MED ORDER — SUGAMMADEX SODIUM 200 MG/2ML IV SOLN
INTRAVENOUS | Status: DC | PRN
  Administered 2021-04-01: 180 mg via INTRAVENOUS

## 2021-04-01 MED ORDER — SUCCINYLCHOLINE CHLORIDE 200 MG/10ML IV SOSY
PREFILLED_SYRINGE | INTRAVENOUS | Status: AC
  Filled 2021-04-01: qty 10

## 2021-04-01 MED ORDER — FENTANYL CITRATE (PF) 100 MCG/2ML IJ SOLN
INTRAMUSCULAR | Status: DC | PRN
  Administered 2021-04-01: 50 ug via INTRAVENOUS

## 2021-04-01 MED ORDER — LIDOCAINE 2% (20 MG/ML) 5 ML SYRINGE
INTRAMUSCULAR | Status: DC | PRN
  Administered 2021-04-01: 80 mg via INTRAVENOUS

## 2021-04-01 SURGICAL SUPPLY — 84 items
APL SKNCLS STERI-STRIP NONHPOA (GAUZE/BANDAGES/DRESSINGS)
BANDAGE ESMARK 6X9 LF (GAUZE/BANDAGES/DRESSINGS) ×1 IMPLANT
BENZOIN TINCTURE PRP APPL 2/3 (GAUZE/BANDAGES/DRESSINGS) IMPLANT
BIT DRILL 2.5X2.75 QC CALB (BIT) ×2 IMPLANT
BIT DRILL 2.9 CANN QC NONSTRL (BIT) ×2 IMPLANT
BIT DRILL 3.5X5.5 QC CALB (BIT) ×2 IMPLANT
BLADE SURG 15 STRL LF DISP TIS (BLADE) ×2 IMPLANT
BLADE SURG 15 STRL SS (BLADE) ×4
BNDG CMPR 9X6 STRL LF SNTH (GAUZE/BANDAGES/DRESSINGS) ×1
BNDG ELASTIC 3X5.8 VLCR STR LF (GAUZE/BANDAGES/DRESSINGS) ×2 IMPLANT
BNDG ELASTIC 4X5.8 VLCR STR LF (GAUZE/BANDAGES/DRESSINGS) ×2 IMPLANT
BNDG ELASTIC 6X5.8 VLCR STR LF (GAUZE/BANDAGES/DRESSINGS) IMPLANT
BNDG ESMARK 6X9 LF (GAUZE/BANDAGES/DRESSINGS) ×2
CANISTER SUCT 1200ML W/VALVE (MISCELLANEOUS) IMPLANT
COVER BACK TABLE 60X90IN (DRAPES) ×2 IMPLANT
CUFF TOURN SGL QUICK 18X4 (TOURNIQUET CUFF) ×2 IMPLANT
CUFF TOURN SGL QUICK 34 (TOURNIQUET CUFF)
CUFF TRNQT CYL 34X4.125X (TOURNIQUET CUFF) IMPLANT
DECANTER SPIKE VIAL GLASS SM (MISCELLANEOUS) IMPLANT
DRAPE EXTREMITY T 121X128X90 (DISPOSABLE) ×2 IMPLANT
DRAPE IMP U-DRAPE 54X76 (DRAPES) ×2 IMPLANT
DRAPE OEC MINIVIEW 54X84 (DRAPES) ×2 IMPLANT
DRAPE U-SHAPE 47X51 STRL (DRAPES) ×2 IMPLANT
DRSG EMULSION OIL 3X3 NADH (GAUZE/BANDAGES/DRESSINGS) IMPLANT
DURAPREP 26ML APPLICATOR (WOUND CARE) ×2 IMPLANT
ELECT REM PT RETURN 9FT ADLT (ELECTROSURGICAL) ×2
ELECTRODE REM PT RTRN 9FT ADLT (ELECTROSURGICAL) ×1 IMPLANT
GAUZE 4X4 16PLY ~~LOC~~+RFID DBL (SPONGE) IMPLANT
GAUZE SPONGE 4X4 12PLY STRL (GAUZE/BANDAGES/DRESSINGS) ×2 IMPLANT
GAUZE XEROFORM 1X8 LF (GAUZE/BANDAGES/DRESSINGS) ×2 IMPLANT
GLOVE SRG 8 PF TXTR STRL LF DI (GLOVE) ×2 IMPLANT
GLOVE SURG LTX SZ7.5 (GLOVE) ×4 IMPLANT
GLOVE SURG POLYISO LF SZ7.5 (GLOVE) ×2 IMPLANT
GLOVE SURG UNDER POLY LF SZ7 (GLOVE) ×2 IMPLANT
GLOVE SURG UNDER POLY LF SZ8 (GLOVE) ×4
GOWN STRL REUS W/ TWL LRG LVL3 (GOWN DISPOSABLE) ×1 IMPLANT
GOWN STRL REUS W/ TWL XL LVL3 (GOWN DISPOSABLE) ×1 IMPLANT
GOWN STRL REUS W/TWL LRG LVL3 (GOWN DISPOSABLE) ×2
GOWN STRL REUS W/TWL XL LVL3 (GOWN DISPOSABLE) ×6 IMPLANT
K-WIRE ACE 1.6X6 (WIRE) ×4
KWIRE ACE 1.6X6 (WIRE) ×2 IMPLANT
NEEDLE HYPO 22GX1.5 SAFETY (NEEDLE) IMPLANT
NS IRRIG 1000ML POUR BTL (IV SOLUTION) ×2 IMPLANT
PACK BASIN DAY SURGERY FS (CUSTOM PROCEDURE TRAY) ×2 IMPLANT
PAD CAST 3X4 CTTN HI CHSV (CAST SUPPLIES) ×1 IMPLANT
PAD CAST 4YDX4 CTTN HI CHSV (CAST SUPPLIES) ×1 IMPLANT
PADDING CAST ABS 4INX4YD NS (CAST SUPPLIES) ×1
PADDING CAST ABS COTTON 4X4 ST (CAST SUPPLIES) ×1 IMPLANT
PADDING CAST COTTON 3X4 STRL (CAST SUPPLIES) ×2
PADDING CAST COTTON 4X4 STRL (CAST SUPPLIES) ×2
PADDING CAST COTTON 6X4 STRL (CAST SUPPLIES) IMPLANT
PENCIL SMOKE EVACUATOR (MISCELLANEOUS) ×2 IMPLANT
PLATE ACE 100DEG 6HOLE (Plate) ×2 IMPLANT
SCREW ACE CAN 4.0 44M (Screw) ×4 IMPLANT
SCREW CORTICAL 3.5MM  12MM (Screw) ×2 IMPLANT
SCREW CORTICAL 3.5MM  16MM (Screw) ×2 IMPLANT
SCREW CORTICAL 3.5MM 12MM (Screw) ×1 IMPLANT
SCREW CORTICAL 3.5MM 14MM (Screw) ×2 IMPLANT
SCREW CORTICAL 3.5MM 16MM (Screw) ×1 IMPLANT
SCREW CORTICAL 3.5MM 22MM (Screw) ×2 IMPLANT
SCREW NLOCK CANC HEX 4X14 (Screw) ×2 IMPLANT
SCREW NLOCK CANC HEX 4X18 (Screw) ×2 IMPLANT
SCREW NLOCK CANC HEX 4X18 FIB (Screw) ×1 IMPLANT
SPLINT FAST PLASTER 5X30 (CAST SUPPLIES) ×24
SPLINT FIBERGLASS 4X30 (CAST SUPPLIES) IMPLANT
SPLINT PLASTER CAST FAST 5X30 (CAST SUPPLIES) ×24 IMPLANT
SPONGE T-LAP 4X18 ~~LOC~~+RFID (SPONGE) ×2 IMPLANT
STAPLER VISISTAT (STAPLE) IMPLANT
STOCKINETTE 6  STRL (DRAPES) ×2
STOCKINETTE 6 STRL (DRAPES) ×1 IMPLANT
STRIP CLOSURE SKIN 1/2X4 (GAUZE/BANDAGES/DRESSINGS) IMPLANT
SUCTION FRAZIER HANDLE 10FR (MISCELLANEOUS) ×2
SUCTION TUBE FRAZIER 10FR DISP (MISCELLANEOUS) ×1 IMPLANT
SUT ETHILON 3 0 PS 1 (SUTURE) ×4 IMPLANT
SUT ETHILON 4 0 PS 2 18 (SUTURE) IMPLANT
SUT MON AB 4-0 PC3 18 (SUTURE) IMPLANT
SUT VIC AB 2-0 SH 27 (SUTURE) ×2
SUT VIC AB 2-0 SH 27XBRD (SUTURE) ×1 IMPLANT
SUT VIC AB 3-0 FS2 27 (SUTURE) ×2 IMPLANT
SUT VICRYL 4-0 PS2 18IN ABS (SUTURE) IMPLANT
SYR 20ML LL LF (SYRINGE) IMPLANT
SYR BULB EAR ULCER 3OZ GRN STR (SYRINGE) ×2 IMPLANT
TUBE CONNECTING 20X1/4 (TUBING) ×2 IMPLANT
UNDERPAD 30X36 HEAVY ABSORB (UNDERPADS AND DIAPERS) ×2 IMPLANT

## 2021-04-01 NOTE — Op Note (Signed)
NAME: Haley Pitts, BESSINGER MEDICAL RECORD NO: [id number] ACCOUNT NO: [id number] DATE OF BIRTH: 1971/02/19 FACILITY: [facility] LOCATION: [facility] PHYSICIAN: Harvie Junior, MD  Operative Report   DATE OF PROCEDURE: 04/01/2021  She is a 50 year old female on the orthopedic surgery service.  PREOPERATIVE DIAGNOSIS:  Bimalleolar ankle fracture, right.  POSTOPERATIVE DIAGNOSIS:  Bimalleolar ankle fracture, right.  PROCEDURE:  1. Open reduction and internal fixation of bimalleolar ankle fracture. 2.interpretation of multiple intraoperative fluoroscopic images  SURGEON:  Jodi Geralds, MD  ASSISTANT:  Greggory Stallion, PA-C, who was present for the entire case and assisted by retraction of tissues, manipulation of the leg and closure to minimize OR time.  BRIEF HISTORY:  The patient is a 50 year old female who was playing tennis and twisted and heard a big pop in her leg.  She was not able to stand or walk.  She came to my office.  X-ray showed a bimalleolar ankle fracture.  We put a significant  compressive dressing on her and put her on the schedule for open reduction and internal fixation.  She was brought to the operating room for this procedure.  DESCRIPTION OF PROCEDURE:  The patient was brought to the operating room.  After adequate anesthesia was obtained with a general anesthetic, the patient was placed supine on the operating table.  The right leg was prepped and draped in the usual sterile  fashion.  Following this, the leg was exsanguinated, blood pressure tourniquet inflated to 250 mmHg.  Following this, an incision was made over the lateral aspect of the ankle.  Subcutaneous tissue dissected down to the level of the fibula.  The fracture  was identified.  The healing elements were removed from the fractured area, irrigated and curetted and a clamp was placed to hold the reduction.  Attention was then turned medially where an incision was made over the medial side.  The fracture was  identified.  Healing elements were irrigated through, the joint was irrigated through and at this point, an anatomic reduction of the medial malleolar piece was undertaken and two guidewires were placed for cannulated screws.  The 2 screws were then  measured and 44 mm partially threaded screws were placed across the fracture site and anatomic reduction and compressive reduction were achieved.  Following this, attention was turned towards the lateral ankle where a manipulative closed reduction was  undertaken and held in place with a clamp and once this was held, we used an interfragmentary compressive screw to hold the fracture and then a 6-hole one-third tubular plate was contoured and placed with 2 cancellous screws distally and 3 cortical  screws proximally.  Anatomic reduction was achieved.  Final x-rays were taken.  Multiple x-rays were taken throughout the case to make sure that we had good screw lengths, anatomic reduction of the fracture.  At this point, the wounds were irrigated and  suctioned dry.  I closed the medial side with 3-0 Vicryl and 3-0 nylon running.  The lateral side was closed with 0 Vicryl, 2-0 Vicryl and 3-0 nylon sutures.  Sterile compressive dressing was applied as well as a U and a posterior splint and the patient  was then taken to recovery room.  She was noted to be in satisfactory condition.  Estimated blood loss for procedure was minimal.   SHW D: 04/01/2021 4:20:11 pm T: 04/01/2021 11:45:00 pm  JOB: [id number]/ [id number]

## 2021-04-01 NOTE — Discharge Instructions (Addendum)
  Post Anesthesia Home Care Instructions  Activity: Get plenty of rest for the remainder of the day. A responsible individual must stay with you for 24 hours following the procedure.  For the next 24 hours, DO NOT: -Drive a car -Advertising copywriter -Drink alcoholic beverages -Take any medication unless instructed by your physician -Make any legal decisions or sign important papers.  Meals: Start with liquid foods such as gelatin or soup. Progress to regular foods as tolerated. Avoid greasy, spicy, heavy foods. If nausea and/or vomiting occur, drink only clear liquids until the nausea and/or vomiting subsides. Call your physician if vomiting continues.  Special Instructions/Symptoms: Your throat may feel dry or sore from the anesthesia or the breathing tube placed in your throat during surgery. If this causes discomfort, gargle with warm salt water. The discomfort should disappear within 24 hours.  If you had a scopolamine patch placed behind your ear for the management of post- operative nausea and/or vomiting:  1. The medication in the patch is effective for 72 hours, after which it should be removed.  Wrap patch in a tissue and discard in the trash. Wash hands thoroughly with soap and water. 2. You may remove the patch earlier than 72 hours if you experience unpleasant side effects which may include dry mouth, dizziness or visual disturbances. 3. Avoid touching the patch. Wash your hands with soap and water after contact with the patch.    Regional Anesthesia Blocks  1. Numbness or the inability to move the "blocked" extremity may last from 3-48 hours after placement. The length of time depends on the medication injected and your individual response to the medication. If the numbness is not going away after 48 hours, call your surgeon.  2. The extremity that is blocked will need to be protected until the numbness is gone and the  Strength has returned. Because you cannot feel it, you will  need to take extra care to avoid injury. Because it may be weak, you may have difficulty moving it or using it. You may not know what position it is in without looking at it while the block is in effect.  3. For blocks in the legs and feet, returning to weight bearing and walking needs to be done carefully. You will need to wait until the numbness is entirely gone and the strength has returned. You should be able to move your leg and foot normally before you try and bear weight or walk. You will need someone to be with you when you first try to ensure you do not fall and possibly risk injury.  4. Bruising and tenderness at the needle site are common side effects and will resolve in a few days.  5. Persistent numbness or new problems with movement should be communicated to the surgeon or the [facility] [phone] [facility] [phone]).   Next dose of pain meds can be taken at 730pm if needed today.

## 2021-04-01 NOTE — H&P (Signed)
A pre op hand p   Chief Complaint: Right ankle pain  HPI: Haley Pitts is a 50 y.o. female who presents for evaluation of right ankle pain. It has been present for 2 days after fall and has been worsening. She has failed conservative measures. Pain is rated as moderate.  Past Medical History:  Diagnosis Date   Anemia    Bimalleolar ankle fracture, right, closed, initial encounter    Hyperlipidemia    diet controlled   PONV (postoperative nausea and vomiting)    Sickle cell trait (HCC)    Past Surgical History:  Procedure Laterality Date   CESAREAN SECTION     OOPHORECTOMY Left    OVARIAN CYST SURGERY     WISDOM TOOTH EXTRACTION     Social History   Socioeconomic History   Marital status: Married    Spouse name: Not on file   Number of children: 2   Years of education: Not on file   Highest education level: Not on file  Occupational History   Occupation: Interior and spatial designer daycare  Tobacco Use   Smoking status: Never   Smokeless tobacco: Never  Vaping Use   Vaping Use: Never used  Substance and Sexual Activity   Alcohol use: Yes    Comment: social   Drug use: Never   Sexual activity: Yes    Birth control/protection: None    Comment: no periods >81yrs  Other Topics Concern   Not on file  Social History Narrative   Not on file   Social Determinants of Health   Financial Resource Strain: Not on file  Food Insecurity: Not on file  Transportation Needs: Not on file  Physical Activity: Not on file  Stress: Not on file  Social Connections: Not on file   Family History  Problem Relation Age of Onset   Hypertension Mother    Diabetes Mother    Colon polyps Mother 16   Colon cancer Neg Hx    Esophageal cancer Neg Hx    Rectal cancer Neg Hx    Stomach cancer Neg Hx    No Known Allergies Prior to Admission medications   Medication Sig Start Date End Date Taking? Authorizing Provider  docusate sodium (COLACE) 100 MG capsule Take 100 mg by mouth 2 (two) times daily.    Yes [provider]  Iron, Ferrous Sulfate, 325 (65 Fe) MG TABS Take 1 tablet by mouth as directed.   Yes [provider]  Multiple Vitamin (MULTIVITAMIN ADULT PO) multivitamin   Yes [provider]  Omega-3 Fatty Acids (OMEGA 3 PO) Take by mouth.   Yes [provider]  oxyCODONE-acetaminophen (PERCOCET/ROXICET) 5-325 MG tablet Take by mouth every 4 (four) hours as needed for severe pain.   Yes [provider]     Positive ROS: none  All other systems have been reviewed and were otherwise negative with the exception of those mentioned in the HPI and as above.  Physical Exam: Vitals:   04/01/21 1007  BP: 110/63  Pulse: (!) 47  Resp: 18  Temp: 97.8 F (36.6 C)  SpO2: 100%    General: Alert, no acute distress Cardiovascular: No pedal edema Respiratory: No cyanosis, no use of accessory musculature GI: No organomegaly, abdomen is soft and non-tender Skin: No lesions in the area of chief complaint Neurologic: Sensation intact distally Psychiatric: Patient is competent for consent with normal mood and affect Lymphatic: No axillary or cervical lymphadenopathy  MUSCULOSKELETAL: r ankle painful rom limited rom  Xray  bi mal ankle fracture with displacement  Assessment/Plan: RIGHT ANKLE BIMALLEOLUS FRACTURE Plan for Procedure(s): OPEN REDUCTION INTERNAL FIXATION (ORIF) ANKLE FRACTURE  The risks benefits and alternatives were discussed with the patient including but not limited to the risks of nonoperative treatment, versus surgical intervention including infection, bleeding, nerve injury, malunion, nonunion, hardware prominence, hardware failure, need for hardware removal, blood clots, cardiopulmonary complications, morbidity, mortality, among others, and they were willing to proceed.  Predicted outcome is good, although there will be at least a six to nine month expected recovery.  Harvie Junior, MD 04/01/2021 10:48 AM

## 2021-04-01 NOTE — Anesthesia Postprocedure Evaluation (Signed)
Anesthesia Post Note  Patient: Haley Pitts  Procedure(s) Performed: OPEN REDUCTION INTERNAL FIXATION (ORIF) ANKLE FRACTURE (Right: Ankle)     Patient location during evaluation: PACU Anesthesia Type: General Level of consciousness: awake and alert Pain management: pain level controlled Vital Signs Assessment: post-procedure vital signs reviewed and stable Respiratory status: spontaneous breathing, nonlabored ventilation and respiratory function stable Cardiovascular status: blood pressure returned to baseline and stable Postop Assessment: no apparent nausea or vomiting Anesthetic complications: no   No notable events documented.  Last Vitals:  Vitals:   04/01/21 1515 04/01/21 1615  BP: 97/64 99/63  Pulse: 62 65  Resp: 16 16  Temp:  36.6 C  SpO2: 95% 96%    Last Pain:  Vitals:   04/01/21 1615  TempSrc:   PainSc: 0-No pain                 Lowella Curb

## 2021-04-01 NOTE — Anesthesia Procedure Notes (Addendum)
  Anesthesia Regional Block: Popliteal block   Pre-Anesthetic Checklist: , timeout performed,  Correct Patient, Correct Site, Correct Laterality,  Correct Procedure, Correct Position, site marked,  Risks and benefits discussed,  Pre-op evaluation,  At surgeon's request and post-op pain management  Laterality: Right  Prep: Maximum Sterile Barrier Precautions used, chloraprep       Needles:  Injection technique: Single-shot  Needle Type: Echogenic Stimulator Needle     Needle Length: 9cm  Needle Gauge: 22     Additional Needles:   Procedures:,,,, ultrasound used (permanent image in chart),,    Narrative:  Start time: 04/01/2021 12:03 PM End time: 04/01/2021 12:05 PM Injection made incrementally with aspirations every 5 mL.  Performed by: Personally  Anesthesiologist: Kaylyn Layer, MD  Additional Notes: Risks, benefits, and alternative discussed. Patient gave consent for procedure. Patient prepped and draped in sterile fashion. Sedation administered, patient remains easily responsive to voice. Relevant anatomy identified with ultrasound guidance. Local anesthetic given in 5cc increments with no signs or symptoms of intravascular injection. No pain or paraesthesias with injection. Patient monitored throughout procedure with signs of LAST or immediate complications. Tolerated well. Ultrasound image placed in chart.  Amalia Greenhouse, MD

## 2021-04-01 NOTE — Anesthesia Procedure Notes (Signed)
Procedure Name: Intubation Date/Time: 04/01/2021 12:48 PM Performed by: Thornell Mule, CRNA Pre-anesthesia Checklist: Patient identified, Emergency Drugs available, Suction available and Patient being monitored Patient Re-evaluated:Patient Re-evaluated prior to induction Oxygen Delivery Method: Circle system utilized Preoxygenation: Pre-oxygenation with 100% oxygen Induction Type: IV induction Ventilation: Mask ventilation without difficulty Laryngoscope Size: Miller and 3 Grade View: Grade I Tube type: Oral Tube size: 7.0 mm Number of attempts: 1 Airway Equipment and Method: Stylet and Oral airway Placement Confirmation: ETT inserted through vocal cords under direct vision, positive ETCO2 and breath sounds checked- equal and bilateral Secured at: 21 cm Tube secured with: Tape Dental Injury: Teeth and Oropharynx as per pre-operative assessment

## 2021-04-01 NOTE — Brief Op Note (Signed)
04/01/2021  4:11 PM  PATIENT:  Haley Pitts  50 y.o. female  PRE-OPERATIVE DIAGNOSIS:  RIGHT ANKLE BIMALLEOLUS FRACTURE  POST-OPERATIVE DIAGNOSIS:  RIGHT ANKLE BIMALLEOLUS FRACTURE  PROCEDURE:  Procedure(s) with comments: OPEN REDUCTION INTERNAL FIXATION (ORIF) ANKLE FRACTURE (Right) - POP block  SURGEON:  Surgeon(s) and Role:    Jodi Geralds, MD - Primary  PHYSICIAN ASSISTANT:   ASSISTANTS: Greggory Stallion   ANESTHESIA:   general  EBL:  10 mL   BLOOD ADMINISTERED:none  DRAINS: none   LOCAL MEDICATIONS USED:  MARCAINE    and OTHER   SPECIMEN:  No Specimen  DISPOSITION OF SPECIMEN:  N/A  COUNTS:  YES  TOURNIQUET:   Total Tourniquet Time Documented: Calf (Right) - 72 minutes Total: Calf (Right) - 72 minutes   DICTATION: .Other Dictation: Dictation Number [id number]  PLAN OF CARE: Discharge to home after PACU  PATIENT DISPOSITION:  PACU - hemodynamically stable.   Delay start of Pharmacological VTE agent (>24hrs) due to surgical blood loss or risk of bleeding: no

## 2021-04-01 NOTE — Anesthesia Procedure Notes (Signed)
Anesthesia Regional Block: Adductor canal block   Pre-Anesthetic Checklist: , timeout performed,  Correct Patient, Correct Site, Correct Laterality,  Correct Procedure, Correct Position, site marked,  Risks and benefits discussed,  Pre-op evaluation,  At surgeon's request and post-op pain management  Laterality: Right  Prep: Maximum Sterile Barrier Precautions used, chloraprep       Needles:  Injection technique: Single-shot  Needle Type: Echogenic Stimulator Needle     Needle Length: 9cm  Needle Gauge: 22     Additional Needles:   Procedures:,,,, ultrasound used (permanent image in chart),,    Narrative:  Start time: 04/01/2021 12:00 PM End time: 04/01/2021 12:03 PM Injection made incrementally with aspirations every 5 mL.  Performed by: Personally  Anesthesiologist: Kaylyn Layer, MD  Additional Notes: Risks, benefits, and alternative discussed. Patient gave consent for procedure. Patient prepped and draped in sterile fashion. Sedation administered, patient remains easily responsive to voice. Relevant anatomy identified with ultrasound guidance. Local anesthetic given in 5cc increments with no signs or symptoms of intravascular injection. No pain or paraesthesias with injection. Patient monitored throughout procedure with signs of LAST or immediate complications. Tolerated well. Ultrasound image placed in chart.  Amalia Greenhouse, MD

## 2021-04-01 NOTE — Transfer of Care (Signed)
Immediate Anesthesia Transfer of Care Note  Patient: Haley Pitts  Procedure(s) Performed: OPEN REDUCTION INTERNAL FIXATION (ORIF) ANKLE FRACTURE (Right: Ankle)  Patient Location: PACU  Anesthesia Type:General  Level of Consciousness: drowsy, patient cooperative and responds to stimulation  Airway & Oxygen Therapy: Patient Spontanous Breathing and Patient connected to face mask oxygen  Post-op Assessment: Report given to RN and Post -op Vital signs reviewed and stable  Post vital signs: Reviewed and stable  Last Vitals:  Vitals Value Taken Time  BP 92/56 04/01/21 1437  Temp    Pulse 72 04/01/21 1438  Resp 14 04/01/21 1438  SpO2 95 % 04/01/21 1438  Vitals shown include unvalidated device data.  Last Pain:  Vitals:   04/01/21 1007  TempSrc: Oral  PainSc:          Complications: No notable events documented.

## 2021-04-01 NOTE — Progress Notes (Signed)
Assisted Dr. Howze with right, ultrasound guided, popliteal, adductor canal block. Side rails up, monitors on throughout procedure. See vital signs in flow sheet. Tolerated Procedure well. 

## 2021-04-03 ENCOUNTER — Encounter (HOSPITAL_BASED_OUTPATIENT_CLINIC_OR_DEPARTMENT_OTHER): Payer: Self-pay | Admitting: Orthopedic Surgery

## 2021-04-27 ENCOUNTER — Encounter (HOSPITAL_BASED_OUTPATIENT_CLINIC_OR_DEPARTMENT_OTHER): Payer: Self-pay | Admitting: Orthopedic Surgery

## 2021-04-27 MED ORDER — 0.9 % SODIUM CHLORIDE (POUR BTL) OPTIME
TOPICAL | Status: DC | PRN
  Administered 2021-04-01: 200 mL

## 2021-10-13 DIAGNOSIS — Z13220 Encounter for screening for lipoid disorders: Secondary | ICD-10-CM | POA: Diagnosis not present

## 2021-10-13 DIAGNOSIS — Z0000 Encounter for general adult medical examination without abnormal findings: Secondary | ICD-10-CM | POA: Diagnosis not present

## 2022-04-20 DIAGNOSIS — R1909 Other intra-abdominal and pelvic swelling, mass and lump: Secondary | ICD-10-CM | POA: Diagnosis not present

## 2022-04-20 DIAGNOSIS — R109 Unspecified abdominal pain: Secondary | ICD-10-CM | POA: Diagnosis not present

## 2022-04-27 DIAGNOSIS — R1031 Right lower quadrant pain: Secondary | ICD-10-CM | POA: Diagnosis not present

## 2022-05-06 ENCOUNTER — Other Ambulatory Visit: Payer: Self-pay | Admitting: Family Medicine

## 2022-05-06 DIAGNOSIS — R222 Localized swelling, mass and lump, trunk: Secondary | ICD-10-CM | POA: Diagnosis not present

## 2022-05-07 ENCOUNTER — Other Ambulatory Visit: Payer: Self-pay

## 2022-05-10 ENCOUNTER — Ambulatory Visit
Admission: RE | Admit: 2022-05-10 | Discharge: 2022-05-10 | Disposition: A | Discharge status: Home / Self Care | Payer: 59 | Source: Ambulatory Visit | Attending: Family Medicine | Admitting: Family Medicine

## 2022-05-10 DIAGNOSIS — K439 Ventral hernia without obstruction or gangrene: Secondary | ICD-10-CM | POA: Diagnosis not present

## 2022-05-10 DIAGNOSIS — R222 Localized swelling, mass and lump, trunk: Secondary | ICD-10-CM

## 2022-05-26 DIAGNOSIS — K439 Ventral hernia without obstruction or gangrene: Secondary | ICD-10-CM | POA: Diagnosis not present

## 2022-05-26 DIAGNOSIS — K429 Umbilical hernia without obstruction or gangrene: Secondary | ICD-10-CM | POA: Diagnosis not present

## 2022-06-03 DIAGNOSIS — Z6828 Body mass index (BMI) 28.0-28.9, adult: Secondary | ICD-10-CM | POA: Diagnosis not present

## 2022-06-03 DIAGNOSIS — Z1231 Encounter for screening mammogram for malignant neoplasm of breast: Secondary | ICD-10-CM | POA: Diagnosis not present

## 2022-06-03 DIAGNOSIS — Z01419 Encounter for gynecological examination (general) (routine) without abnormal findings: Secondary | ICD-10-CM | POA: Diagnosis not present

## 2022-08-18 IMAGING — MG DIGITAL DIAGNOSTIC BILAT W/ TOMO W/ CAD
8 of 14 series · 8 of 40 positions shown · non-contrast
Comparison: Previous exam(s).
COMPARISON: Previous exam(s).

Addendum:
CLINICAL DATA: 49-year-old female presenting for annual bilateral
mammogram and final follow-up of a probably benign right breast
mass.

EXAM:
DIGITAL DIAGNOSTIC BILATERAL MAMMOGRAM WITH TOMOSYNTHESIS AND CAD;
ULTRASOUND LEFT BREAST LIMITED
TECHNIQUE: Bilateral digital diagnostic mammography and breast tomosynthesis
was performed. The images were evaluated with computer-aided
detection.; Targeted ultrasound examination of the left breast was
performed
mammogram and final follow-up of a probably benign LEFT breast mass.
*** End of Addendum ***

[L ML synth-2D]
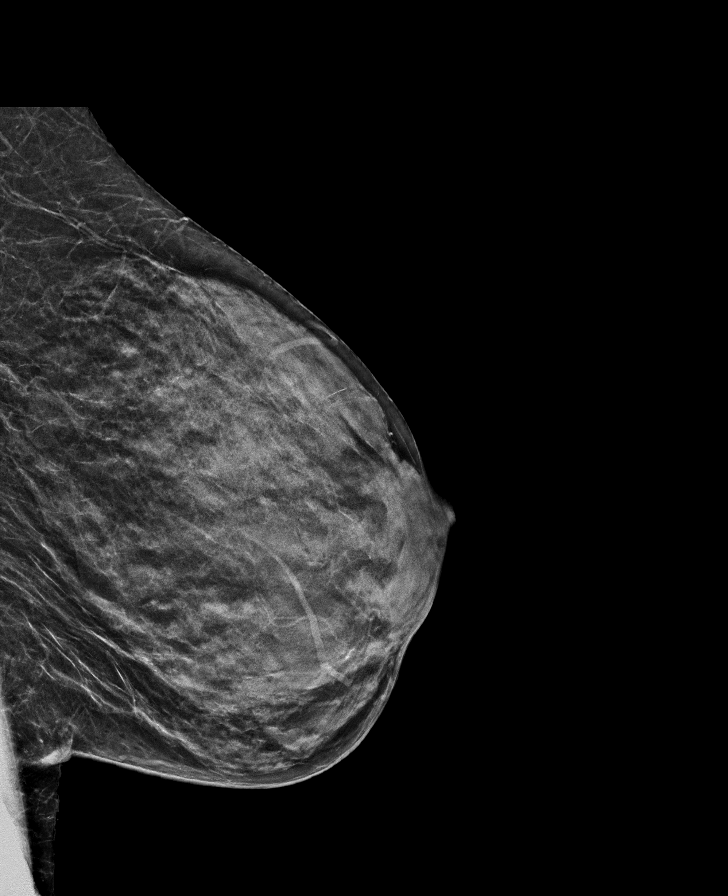

[L MLO synth-2D (1 of 2)]
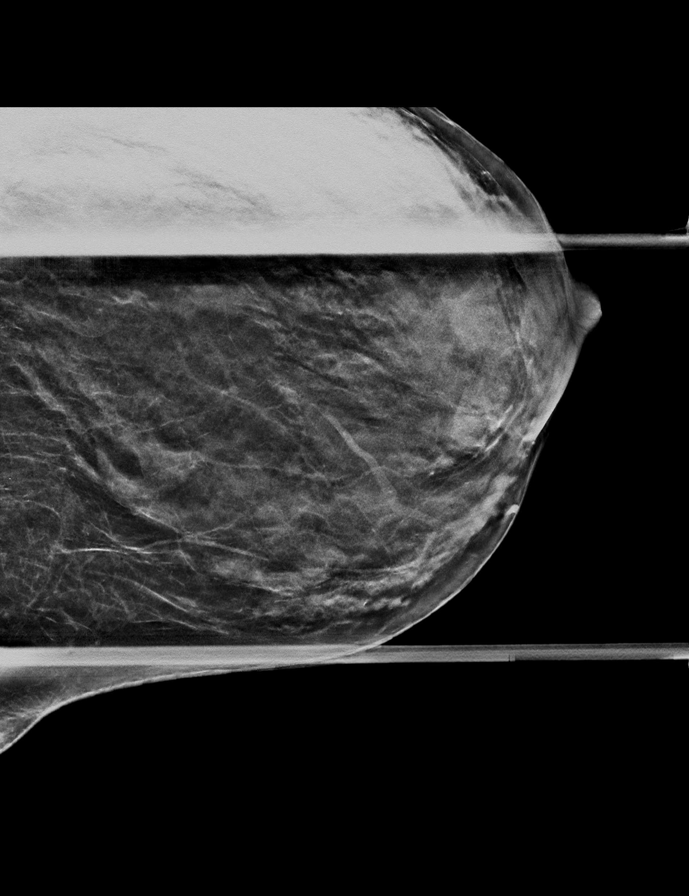

[L CC synth-2D (1 of 2)]
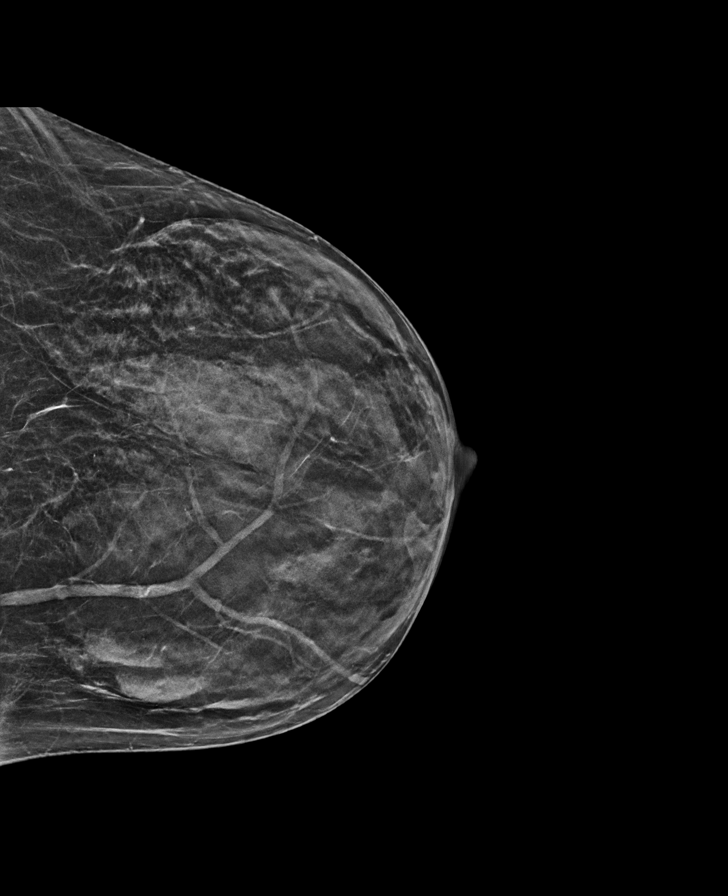

[R MLO synth-2D]
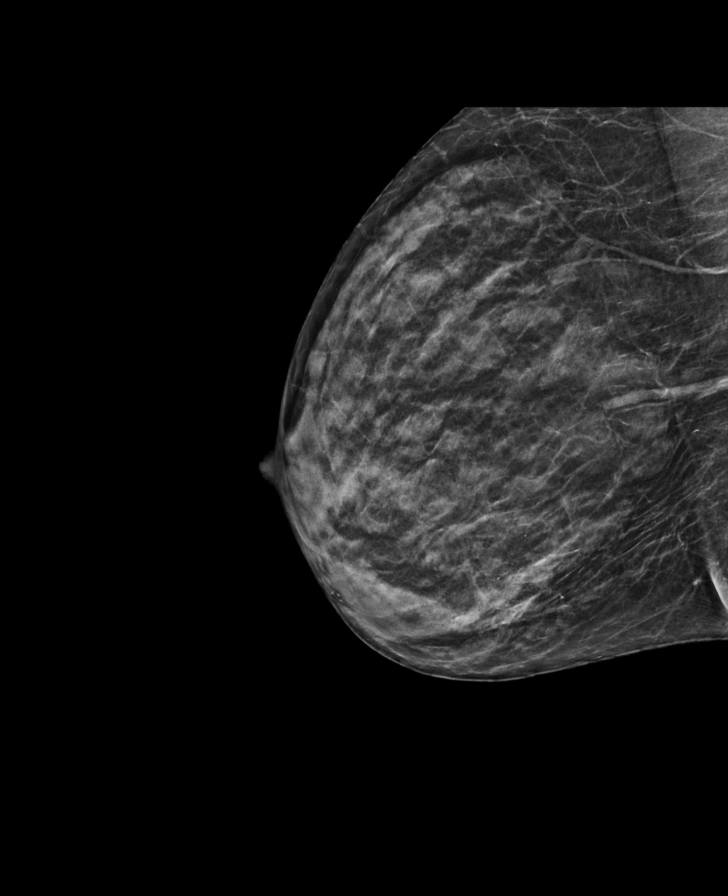

[L MLO synth-2D (2 of 2)]
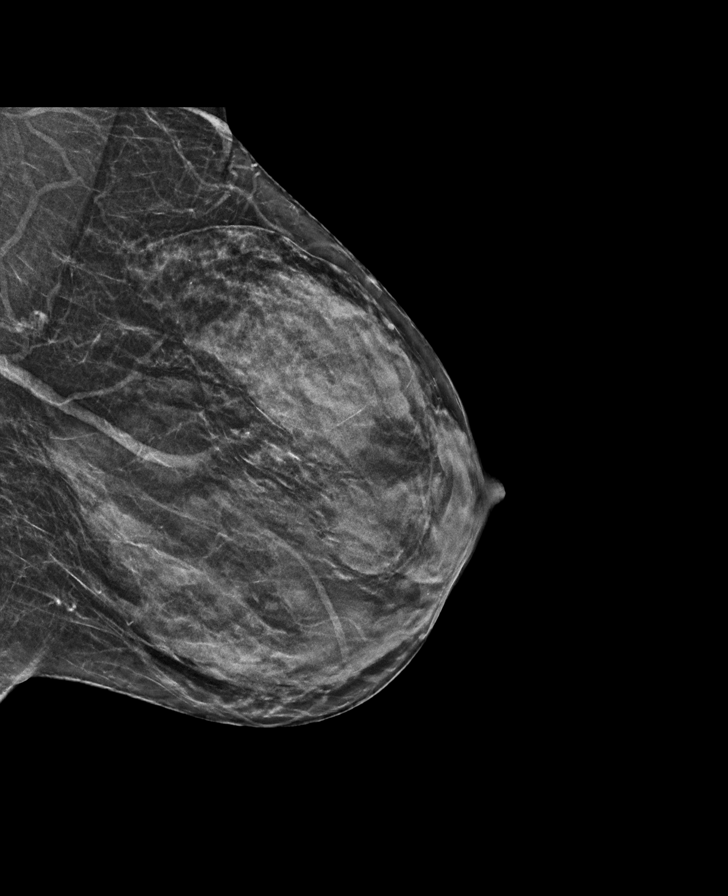

[L CC synth-2D (2 of 2)]
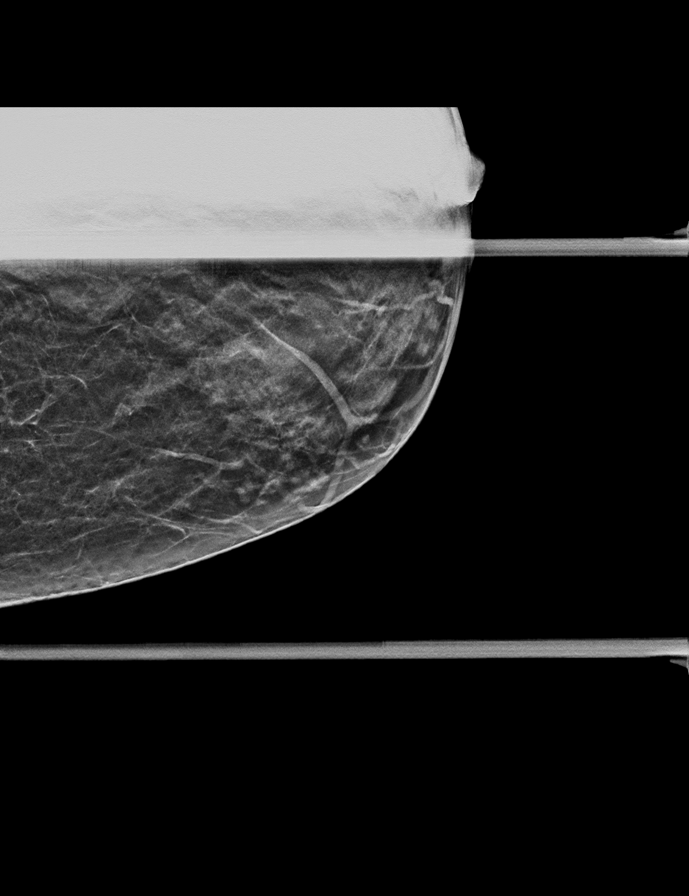

[R CC synth-2D]
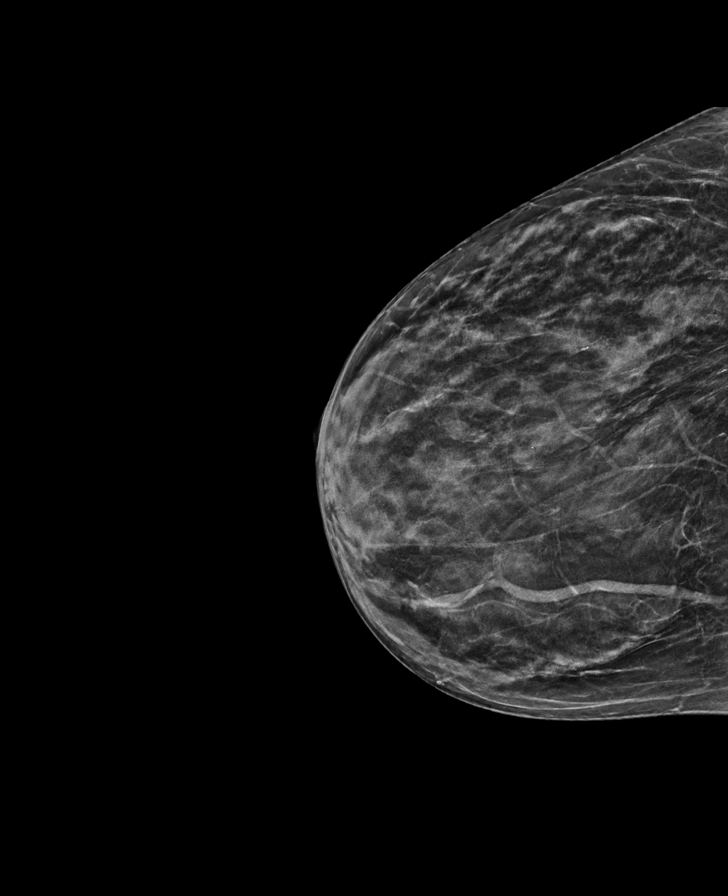

[L CC tomo · tomo slice 25/50.0]
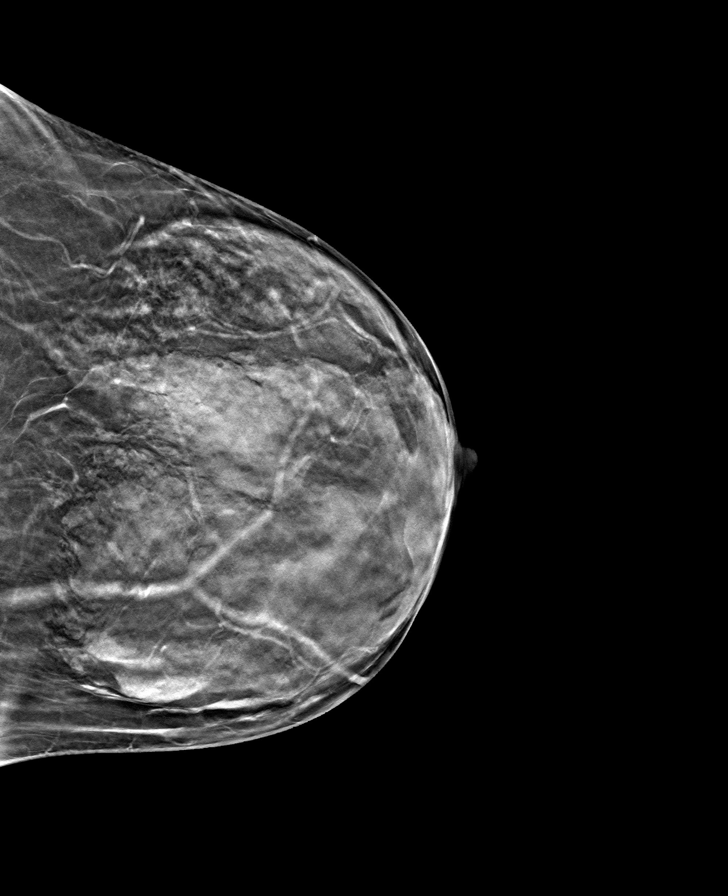

[8 of 40 positions shown; findings below may reference images not displayed]

ACR Breast Density Category d: The breast tissue is extremely dense,
which lowers the sensitivity of mammography.
FINDINGS: Stable mammographic appearance of the bilateral breasts. No new or
suspicious findings identified.

Targeted ultrasound is performed, showing stable appearance of an
oval, circumscribed hypoechoic mass at the 9 o'clock position 5 cm
from the nipple. Today it measures 4 x 3 x 2 mm (previously 4 x 3 x
2 mm).
IMPRESSION: 1. No mammographic evidence of malignancy in either breast.
2. Benign left breast mass demonstrating 2 year stability/interval
decrease in size. No further imaging follow-up required.

RECOMMENDATION:
Screening mammogram in one year.(Code:PO-E-JI2)

I have discussed the findings and recommendations with the patient.
If applicable, a reminder letter will be sent to the patient
regarding the next appointment.

BI-RADS CATEGORY  2: Benign.
ACR Breast Density Category d: The breast tissue is extremely dense,
which lowers the sensitivity of mammography.
FINDINGS: Stable mammographic appearance of the bilateral breasts. No new or
suspicious findings identified.

Targeted ultrasound is performed, showing stable appearance of an
oval, circumscribed hypoechoic mass at the 9 o'clock position 5 cm
from the nipple. Today it measures 4 x 3 x 2 mm (previously 4 x 3 x
2 mm).
IMPRESSION: 1. No mammographic evidence of malignancy in either breast.
2. Benign left breast mass demonstrating 2 year stability/interval
decrease in size. No further imaging follow-up required.

RECOMMENDATION:
Screening mammogram in one year.(Code:PO-E-JI2)

I have discussed the findings and recommendations with the patient.
If applicable, a reminder letter will be sent to the patient
regarding the next appointment.

BI-RADS CATEGORY  2: Benign.

## 2022-11-02 DIAGNOSIS — Z13220 Encounter for screening for lipoid disorders: Secondary | ICD-10-CM | POA: Diagnosis not present

## 2022-11-02 DIAGNOSIS — Z0000 Encounter for general adult medical examination without abnormal findings: Secondary | ICD-10-CM | POA: Diagnosis not present

## 2023-01-11 DIAGNOSIS — D649 Anemia, unspecified: Secondary | ICD-10-CM | POA: Diagnosis not present

## 2023-02-08 ENCOUNTER — Other Ambulatory Visit: Payer: Self-pay | Admitting: Medical Oncology

## 2023-02-08 DIAGNOSIS — D649 Anemia, unspecified: Secondary | ICD-10-CM

## 2023-02-10 ENCOUNTER — Encounter: Payer: Self-pay | Admitting: Internal Medicine

## 2023-02-10 ENCOUNTER — Inpatient Hospital Stay: Payer: 59

## 2023-02-10 ENCOUNTER — Inpatient Hospital Stay: Payer: 59 | Attending: Internal Medicine | Admitting: Internal Medicine

## 2023-02-10 VITALS — BP 105/75 | HR 64 | Temp 97.3°F | Resp 15 | Ht 66.93 in | Wt 174.8 lb

## 2023-02-10 DIAGNOSIS — D649 Anemia, unspecified: Secondary | ICD-10-CM | POA: Diagnosis not present

## 2023-02-10 DIAGNOSIS — D72819 Decreased white blood cell count, unspecified: Secondary | ICD-10-CM | POA: Diagnosis not present

## 2023-02-10 DIAGNOSIS — E785 Hyperlipidemia, unspecified: Secondary | ICD-10-CM | POA: Diagnosis not present

## 2023-02-10 DIAGNOSIS — Z800 Family history of malignant neoplasm of digestive organs: Secondary | ICD-10-CM | POA: Diagnosis not present

## 2023-02-10 DIAGNOSIS — D573 Sickle-cell trait: Secondary | ICD-10-CM | POA: Diagnosis not present

## 2023-02-10 DIAGNOSIS — Z79899 Other long term (current) drug therapy: Secondary | ICD-10-CM | POA: Insufficient documentation

## 2023-02-10 LAB — CBC WITH DIFFERENTIAL (CANCER CENTER ONLY)
Abs Immature Granulocytes: 0.01 10*3/uL (ref 0.00–0.07)
Basophils Absolute: 0 10*3/uL (ref 0.0–0.1)
Basophils Relative: 1 %
Eosinophils Absolute: 0.1 10*3/uL (ref 0.0–0.5)
Eosinophils Relative: 1 %
HCT: 34 % — ABNORMAL LOW (ref 36.0–46.0)
Hemoglobin: 11.2 g/dL — ABNORMAL LOW (ref 12.0–15.0)
Immature Granulocytes: 0 %
Lymphocytes Relative: 28 %
Lymphs Abs: 1.4 10*3/uL (ref 0.7–4.0)
MCH: 28.1 pg (ref 26.0–34.0)
MCHC: 32.9 g/dL (ref 30.0–36.0)
MCV: 85.2 fL (ref 80.0–100.0)
Monocytes Absolute: 0.4 10*3/uL (ref 0.1–1.0)
Monocytes Relative: 8 %
Neutro Abs: 3.2 10*3/uL (ref 1.7–7.7)
Neutrophils Relative %: 62 %
Platelet Count: 219 10*3/uL (ref 150–400)
RBC: 3.99 MIL/uL (ref 3.87–5.11)
RDW: 13.8 % (ref 11.5–15.5)
WBC Count: 5.1 10*3/uL (ref 4.0–10.5)
nRBC: 0 % (ref 0.0–0.2)

## 2023-02-10 LAB — IRON AND IRON BINDING CAPACITY (CC-WL,HP ONLY)
Iron: 66 ug/dL (ref 28–170)
Saturation Ratios: 22 % (ref 10.4–31.8)
TIBC: 295 ug/dL (ref 250–450)
UIBC: 229 ug/dL (ref 148–442)

## 2023-02-10 LAB — CMP (CANCER CENTER ONLY)
ALT: 16 U/L (ref 0–44)
AST: 22 U/L (ref 15–41)
Albumin: 4.1 g/dL (ref 3.5–5.0)
Alkaline Phosphatase: 70 U/L (ref 38–126)
Anion gap: 6 (ref 5–15)
BUN: 23 mg/dL — ABNORMAL HIGH (ref 6–20)
CO2: 28 mmol/L (ref 22–32)
Calcium: 9.2 mg/dL (ref 8.9–10.3)
Chloride: 108 mmol/L (ref 98–111)
Creatinine: 0.75 mg/dL (ref 0.44–1.00)
GFR, Estimated: >60 mL/min (ref >60)
Glucose, Bld: 93 mg/dL (ref 70–99)
Potassium: 3.9 mmol/L (ref 3.5–5.1)
Sodium: 142 mmol/L (ref 135–145)
Total Bilirubin: 0.5 mg/dL (ref 0.3–1.2)
Total Protein: 7.6 g/dL (ref 6.5–8.1)

## 2023-02-10 LAB — FOLATE: Folate: 21 ng/mL (ref >5.9)

## 2023-02-10 LAB — FERRITIN: Ferritin: 179 ng/mL (ref 11–307)

## 2023-02-10 LAB — VITAMIN B12: Vitamin B-12: 952 pg/mL — ABNORMAL HIGH (ref 180–914)

## 2023-02-10 NOTE — Progress Notes (Signed)
[facility] [phone]   [phone]) [phone]  CONSULT NOTE  REFERRING PHYSICIAN: Dr. Aliene Beams  REASON FOR CONSULTATION:  52 years old African-American female with persistent anemia.  HPI Haley Pitts is a 52 y.o. female with past medical history significant for sickle cell trait, dyslipidemia, long history of anemia.  She was seen recently by her primary care provider for routine evaluation and repeat CBC showed low white blood count of 3.1 with low hemoglobin of 11.7 and hematocrit 34.9 with normal platelet count of 209,000.  She had a history of anemia for long time dated back to 2008 where CBC on July 12, 2007 showed hemoglobin of 11.5 and hematocrit of 33.8%.  She had hemoglobin electrophoresis that was consistent with sickle cell trait.  Her iron study showed normal serum iron of 84 with iron saturation of 29% and transferrin of 203.  Ferritin level was normal at 179.2.  She was referred to me today for evaluation and recommendation regarding her condition. The patient has been taking supplements including Centrum and tolerating it fairly well.  She has no concerning symptoms today.  She exercise at regular basis and sometimes she take up to 3 ibuprofen after her exercise. When seen today she has no fatigue or weakness.  She has no dizzy spells.  She denied having any chest pain, shortness of breath, cough or hemoptysis.  She has no nausea, vomiting, diarrhea or constipation.  She has no headache or visual changes. Family history significant for mother and father with hypertension. The patient is married and has 2 children a son and daughter.  She owns a childcare and fitness facility.  She has no history for smoking and drinks alcohol occasionally with no history of drug abuse.  HPI  Past Medical History:  Diagnosis Date   Anemia    Bimalleolar ankle fracture, right, closed, initial encounter    Hyperlipidemia    diet controlled   PONV  (postoperative nausea and vomiting)    Sickle cell trait (HCC)     Past Surgical History:  Procedure Laterality Date   CESAREAN SECTION     OOPHORECTOMY Left    ORIF ANKLE FRACTURE Right 04/01/2021   Procedure: OPEN REDUCTION INTERNAL FIXATION (ORIF) ANKLE FRACTURE;  Surgeon: Jodi Geralds, MD;  Location: [facility];  Service: Orthopedics;  Laterality: Right;  POP block   OVARIAN CYST SURGERY     WISDOM TOOTH EXTRACTION      Family History  Problem Relation Age of Onset   Hypertension Mother    Diabetes Mother    Colon polyps Mother 69   Colon cancer Neg Hx    Esophageal cancer Neg Hx    Rectal cancer Neg Hx    Stomach cancer Neg Hx     Social History Social History   Tobacco Use   Smoking status: Never   Smokeless tobacco: Never  Vaping Use   Vaping Use: Never used  Substance Use Topics   Alcohol use: Yes    Comment: social   Drug use: Never    No Known Allergies  Current Outpatient Medications  Medication Sig Dispense Refill   docusate sodium (COLACE) 100 MG capsule Take 100 mg by mouth 2 (two) times daily.     Iron, Ferrous Sulfate, 325 (65 Fe) MG TABS Take 1 tablet by mouth as directed.     Multiple Vitamin (MULTIVITAMIN ADULT PO) multivitamin     Omega-3 Fatty Acids (OMEGA 3 PO) Take by mouth.  oxyCODONE-acetaminophen (PERCOCET/ROXICET) 5-325 MG tablet Take by mouth every 4 (four) hours as needed for severe pain.     No current facility-administered medications for this visit.    Review of Systems  Constitutional: negative Eyes: negative Ears, nose, mouth, throat, and face: negative Respiratory: negative Cardiovascular: negative Gastrointestinal: negative Genitourinary:negative Integument/breast: negative Hematologic/lymphatic: negative Musculoskeletal:negative Neurological: negative Behavioral/Psych: negative Endocrine: negative Allergic/Immunologic: negative  Physical Exam  ZOX:WRUEA, healthy, no distress, well nourished,  well developed, and anxious SKIN: skin color, texture, turgor are normal, no rashes or significant lesions HEAD: Normocephalic, No masses, lesions, tenderness or abnormalities EYES: normal, PERRLA, Conjunctiva are pink and non-injected EARS: External ears normal, Canals clear OROPHARYNX:no exudate, no erythema, and lips, buccal mucosa, and tongue normal  NECK: supple, no adenopathy, no JVD LYMPH:  no palpable lymphadenopathy, no hepatosplenomegaly BREAST:not examined LUNGS: clear to auscultation , and palpation HEART: regular rate & rhythm, no murmurs, and no gallops ABDOMEN:abdomen soft, non-tender, normal bowel sounds, and no masses or organomegaly BACK: Back symmetric, no curvature., No CVA tenderness EXTREMITIES:no joint deformities, effusion, or inflammation, no edema  NEURO: alert & oriented x 3 with fluent speech, no focal motor/sensory deficits  PERFORMANCE STATUS: ECOG 0  LABORATORY DATA: Lab Results  Component Value Date   WBC 5.1 02/10/2023   HGB 11.2 (L) 02/10/2023   HCT 34.0 (L) 02/10/2023   MCV 85.2 02/10/2023   PLT 219 02/10/2023      Chemistry      Component Value Date/Time   NA 142 02/10/2023 1110   K 3.9 02/10/2023 1110   CL 108 02/10/2023 1110   CO2 28 02/10/2023 1110   BUN 23 (H) 02/10/2023 1110   CREATININE 0.75 02/10/2023 1110      Component Value Date/Time   CALCIUM 9.2 02/10/2023 1110   ALKPHOS 70 02/10/2023 1110   AST 22 02/10/2023 1110   ALT 16 02/10/2023 1110   BILITOT 0.5 02/10/2023 1110       RADIOGRAPHIC STUDIES: No results found.  ASSESSMENT: This is a very pleasant 52 years old African-American female who presented for evaluation of leukocytopenia and anemia. Her leukocytopenia was likely drug-induced from taking over-the-counter medication with ibuprofen but this completely resolved at this point. Her anemia is most consistent with sickle cell trait.   PLAN: I had a lengthy discussion with the patient today about her condition  and further investigation to rule out any other underlying etiology. I ordered several studies today including repeat CBC that showed normal total white blood count of 5.1.  Her hemoglobin was 11.2 and hematocrit 34.0% with normal platelet count. Comprehensive metabolic panel, serum folate, vitamin B12 and iron study were unremarkable.  Ferritin level is still pending. I assured the patient of her condition and recommended for her to continue routine follow-up visit and evaluation by her primary care provider at this point. I will see the patient on as-needed basis at this point.  She can continue her current treatment with the Centrum tablet. The patient was advised to call immediately if she has any concerning symptoms in the interval.  The patient voices understanding of current disease status and treatment options and is in agreement with the current care plan.  All questions were answered. The patient knows to call the clinic with any problems, questions or concerns. We can certainly see the patient much sooner if necessary.  Thank you so much for allowing me to participate in the care of Spruha B Wayment. I will continue to follow up the patient with  you and assist in her care.  The total time spent in the appointment was 60 minutes.  Disclaimer: This note was dictated with voice recognition software. Similar sounding words can inadvertently be transcribed and may not be corrected upon review.   Lajuana Matte Feb 10, 2023, 11:35 AM

## 2023-06-09 DIAGNOSIS — Z01419 Encounter for gynecological examination (general) (routine) without abnormal findings: Secondary | ICD-10-CM | POA: Diagnosis not present

## 2023-06-09 DIAGNOSIS — Z1231 Encounter for screening mammogram for malignant neoplasm of breast: Secondary | ICD-10-CM | POA: Diagnosis not present

## 2023-06-09 DIAGNOSIS — Z6828 Body mass index (BMI) 28.0-28.9, adult: Secondary | ICD-10-CM | POA: Diagnosis not present

## 2023-11-23 DIAGNOSIS — Z0000 Encounter for general adult medical examination without abnormal findings: Secondary | ICD-10-CM | POA: Diagnosis not present

## 2023-11-23 DIAGNOSIS — D649 Anemia, unspecified: Secondary | ICD-10-CM | POA: Diagnosis not present

## 2023-11-23 DIAGNOSIS — Z13220 Encounter for screening for lipoid disorders: Secondary | ICD-10-CM | POA: Diagnosis not present

## 2024-07-03 DIAGNOSIS — Z1231 Encounter for screening mammogram for malignant neoplasm of breast: Secondary | ICD-10-CM | POA: Diagnosis not present

## 2024-07-03 DIAGNOSIS — R1031 Right lower quadrant pain: Secondary | ICD-10-CM | POA: Diagnosis not present

## 2024-07-03 DIAGNOSIS — Z01419 Encounter for gynecological examination (general) (routine) without abnormal findings: Secondary | ICD-10-CM | POA: Diagnosis not present

## 2024-07-03 DIAGNOSIS — Z6829 Body mass index (BMI) 29.0-29.9, adult: Secondary | ICD-10-CM | POA: Diagnosis not present

## 2024-07-25 DIAGNOSIS — R1031 Right lower quadrant pain: Secondary | ICD-10-CM | POA: Diagnosis not present
